# Patient Record
Sex: Female | Born: 1970 | Race: White | Hispanic: No | Marital: Married | Smoking: Former smoker
Health system: Southern US, Community
[De-identification: ages and names within clinical notes are randomized; demographics above are authoritative.]

## PROBLEM LIST (undated history)

## (undated) HISTORY — PX: TONSILLECTOMY: SHX5217

---

## 1999-04-25 ENCOUNTER — Other Ambulatory Visit: Admission: RE | Admit: 1999-04-25 | Discharge: 1999-04-25 | Payer: Self-pay | Admitting: Obstetrics and Gynecology

## 1999-05-29 ENCOUNTER — Other Ambulatory Visit: Admission: RE | Admit: 1999-05-29 | Discharge: 1999-05-29 | Payer: Self-pay | Admitting: Obstetrics and Gynecology

## 2000-01-05 ENCOUNTER — Other Ambulatory Visit: Admission: RE | Admit: 2000-01-05 | Discharge: 2000-01-05 | Payer: Self-pay | Admitting: Obstetrics and Gynecology

## 2001-06-19 ENCOUNTER — Other Ambulatory Visit: Admission: RE | Admit: 2001-06-19 | Discharge: 2001-06-19 | Payer: Self-pay | Admitting: Obstetrics and Gynecology

## 2001-10-06 ENCOUNTER — Emergency Department (HOSPITAL_COMMUNITY): Admission: EM | Admit: 2001-10-06 | Discharge: 2001-10-06 | Payer: Self-pay | Admitting: Emergency Medicine

## 2002-06-14 ENCOUNTER — Encounter: Payer: Self-pay | Admitting: Emergency Medicine

## 2002-06-14 ENCOUNTER — Emergency Department (HOSPITAL_COMMUNITY): Admission: EM | Admit: 2002-06-14 | Discharge: 2002-06-14 | Payer: Self-pay | Admitting: Emergency Medicine

## 2002-06-22 ENCOUNTER — Other Ambulatory Visit: Admission: RE | Admit: 2002-06-22 | Discharge: 2002-06-22 | Payer: Self-pay | Admitting: *Deleted

## 2002-06-22 ENCOUNTER — Other Ambulatory Visit: Admission: RE | Admit: 2002-06-22 | Discharge: 2002-06-22 | Payer: Self-pay | Admitting: Obstetrics and Gynecology

## 2002-11-17 ENCOUNTER — Emergency Department (HOSPITAL_COMMUNITY): Admission: EM | Admit: 2002-11-17 | Discharge: 2002-11-17 | Payer: Self-pay | Admitting: Emergency Medicine

## 2003-02-02 ENCOUNTER — Other Ambulatory Visit: Admission: RE | Admit: 2003-02-02 | Discharge: 2003-02-02 | Payer: Self-pay | Admitting: Obstetrics and Gynecology

## 2003-07-15 ENCOUNTER — Other Ambulatory Visit: Admission: RE | Admit: 2003-07-15 | Discharge: 2003-07-15 | Payer: Self-pay | Admitting: Obstetrics and Gynecology

## 2003-07-15 ENCOUNTER — Other Ambulatory Visit: Admission: RE | Admit: 2003-07-15 | Discharge: 2003-07-15 | Payer: Self-pay | Admitting: *Deleted

## 2003-07-23 ENCOUNTER — Ambulatory Visit (HOSPITAL_COMMUNITY): Admission: RE | Admit: 2003-07-23 | Discharge: 2003-07-23 | Payer: Self-pay | Admitting: *Deleted

## 2003-07-23 ENCOUNTER — Encounter (INDEPENDENT_AMBULATORY_CARE_PROVIDER_SITE_OTHER): Payer: Self-pay

## 2004-01-03 ENCOUNTER — Emergency Department (HOSPITAL_COMMUNITY): Admission: EM | Admit: 2004-01-03 | Discharge: 2004-01-03 | Payer: Self-pay | Admitting: Family Medicine

## 2004-01-04 ENCOUNTER — Ambulatory Visit (HOSPITAL_COMMUNITY): Admission: RE | Admit: 2004-01-04 | Discharge: 2004-01-04 | Payer: Self-pay | Admitting: Unknown Physician Specialty

## 2004-01-04 IMAGING — CR DG CHEST 2V
2 series · 2 of 2 positions shown · non-contrast
Comparison: none

CLINICAL DATA: Cervical lymphadenopathy.
 TWO VIEW CHEST   - [DATE]
 No prior chest films for comparison.
 Radiographic appearance of the chest appears normal with no evidence of abnormal soft tissue to suggest either mediastinal or hilar adenopathy.  The lungs are clear without infiltrate or edema.  There is mild atelectasis at the right lung base.  Heart size is normal.  The visualized bony thorax is unremarkable.
 IMPRESSION
 Normal appearance of the chest.  No evidence by plain film of obvious hilar or mediastinal adenopathy.

[view not recorded (1 of 2)]
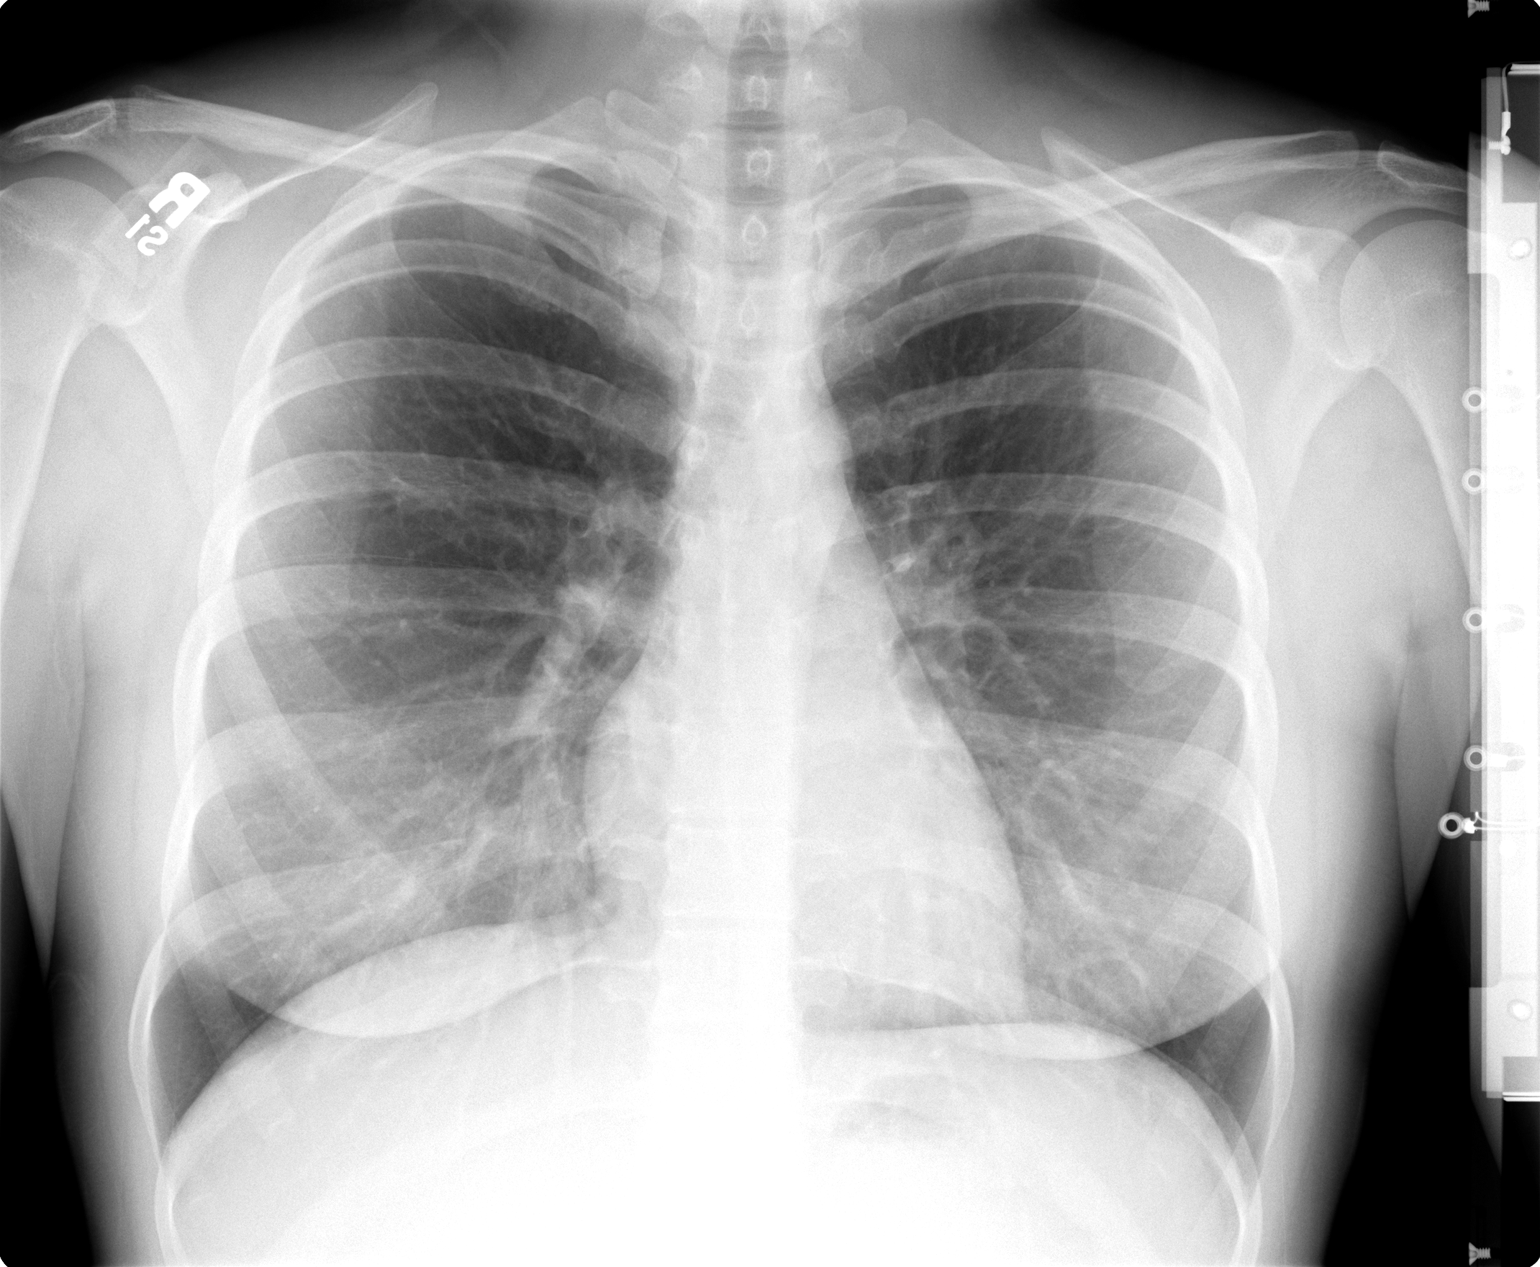

[view not recorded (2 of 2)]
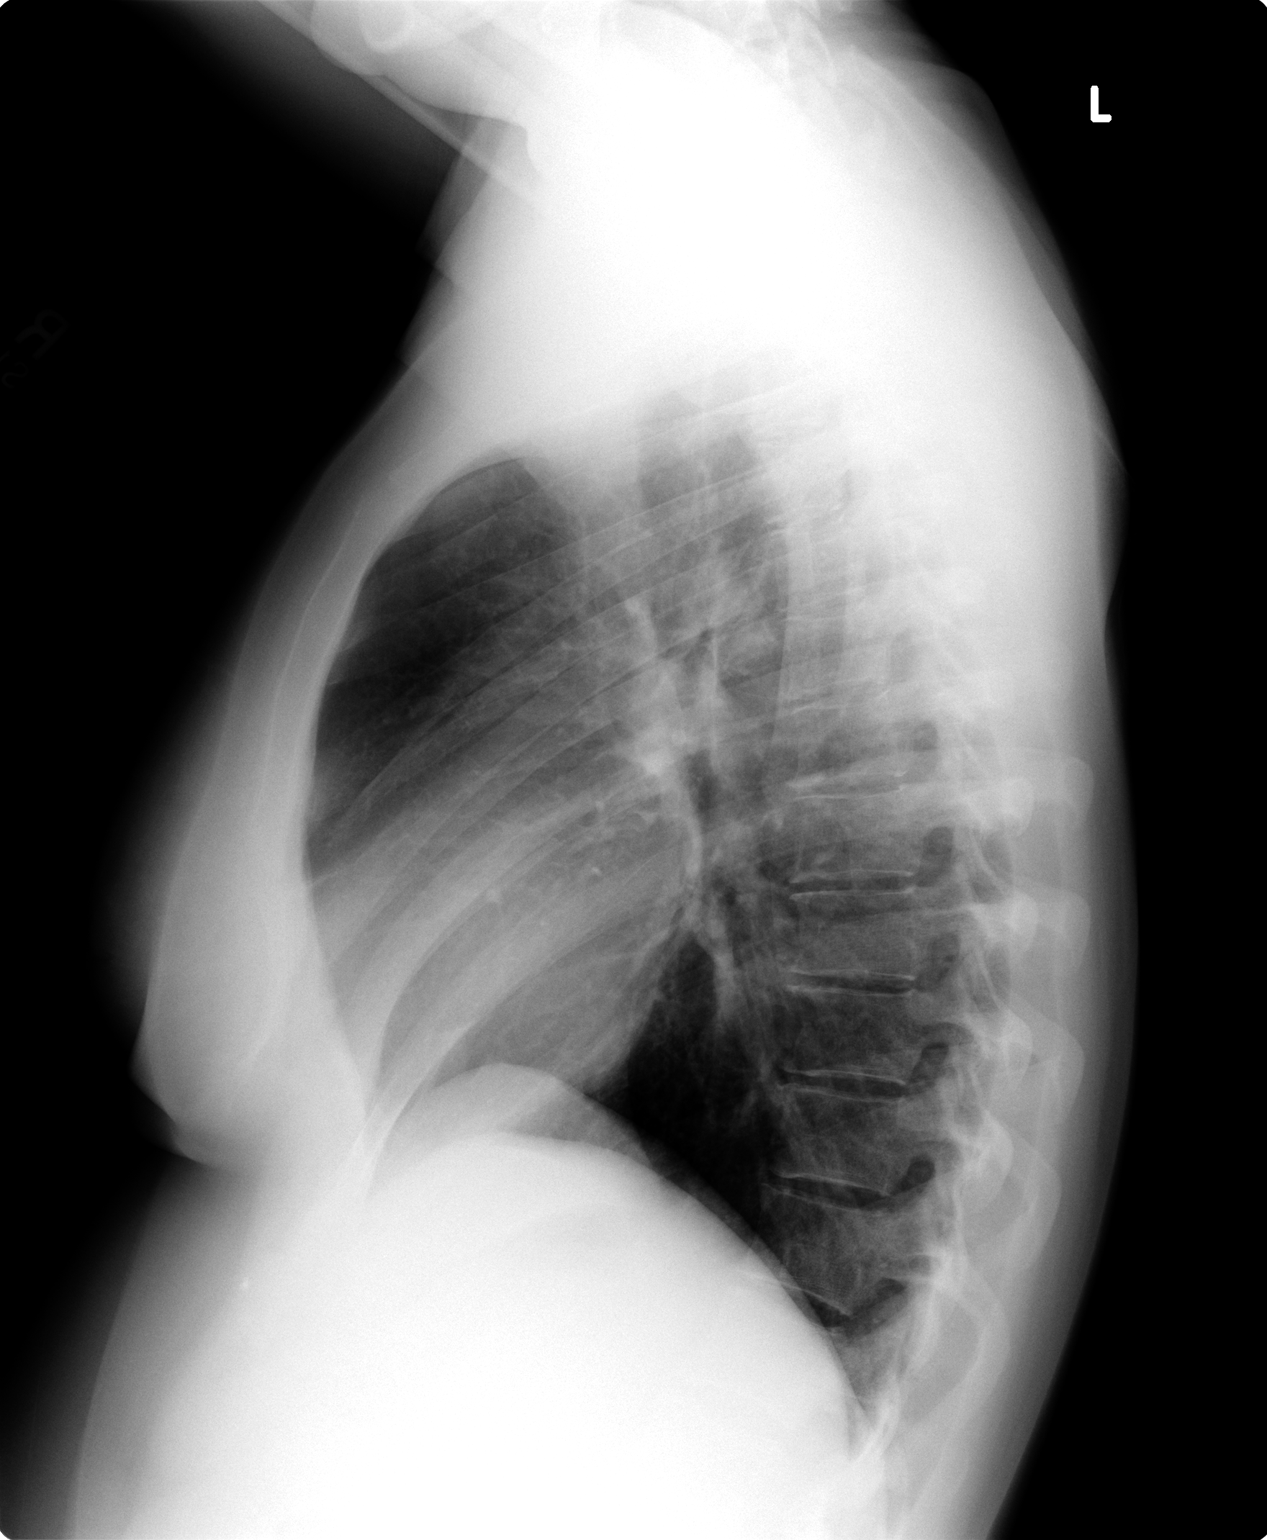

[2 of 2 positions shown; findings below may reference images not displayed]

## 2004-02-08 ENCOUNTER — Other Ambulatory Visit: Admission: RE | Admit: 2004-02-08 | Discharge: 2004-02-08 | Payer: Self-pay | Admitting: Obstetrics and Gynecology

## 2005-01-24 ENCOUNTER — Ambulatory Visit: Payer: Self-pay | Admitting: Family Medicine

## 2005-02-12 ENCOUNTER — Other Ambulatory Visit: Admission: RE | Admit: 2005-02-12 | Discharge: 2005-02-12 | Payer: Self-pay | Admitting: Obstetrics and Gynecology

## 2006-02-18 ENCOUNTER — Other Ambulatory Visit: Admission: RE | Admit: 2006-02-18 | Discharge: 2006-02-18 | Payer: Self-pay | Admitting: Obstetrics & Gynecology

## 2007-07-31 ENCOUNTER — Inpatient Hospital Stay (HOSPITAL_COMMUNITY): Admission: AD | Admit: 2007-07-31 | Discharge: 2007-08-03 | Payer: Self-pay | Admitting: *Deleted

## 2011-04-03 NOTE — Op Note (Signed)
NAMEJAMESA, TEDRICK NO.:  [id number]   MEDICAL RECORD NO.:  [id number]          PATIENT TYPE:  INP   LOCATION:  9199                          [facility]ACILITY:  [facility]   PHYSICIAN:  Gerri Spore B. Earlene Plater, M.D.  DATE OF BIRTH:  22-Sep-1971   DATE OF PROCEDURE:  07/31/2007  DATE OF DISCHARGE:                               OPERATIVE REPORT   PREOPERATIVE DIAGNOSES:  1. Thirty-nine-plus-week intrauterine pregnancy.  2. Gestational hypertension.  3. Suspected macrosomia.   POSTOPERATIVE DIAGNOSES:  1. Thirty-nine-plus-week intrauterine pregnancy.  2. Gestational hypertension.  3. Suspected macrosomis.   OPERATION/PROCEDURE:  Primary low transverse cesarean section.   SURGEON:  Chester Holstein. Earlene Plater, M.D.   ANESTHESIA:  Spinal.   SPECIMENS:  None.   BLOOD LOSS:  700 mL.   COMPLICATIONS:  None.   FINDINGS:  Viable female, 8 and 9 Apgars, 8 pounds 14 ounces.  Normal  uterus, tubes and ovaries.   INDICATIONS:  The patient was seen in the office today for routine  follow up at 39+ weeks.  Found to have blood pressure of 160/100 with  some associated nausea, chest discomfort and occasional visual  disturbances. Trace protein on office urine dipstick. She was sent to  maternity admissions for further evaluation.  Serial blood pressures  remained in the 140/90 range.  Preeclampsia labs were normal.  The fetus  was reassuring and ultrasound was ordered for estimated fetal weight  which was 9 pounds 5 ounces.  It was recommended the patient be  delivered today given her elevated blood pressure at term.  Her cervix  was unfavorable and there was suspected macrosomic fetus.  Limitations  of ultrasound estimate were discussed at length with the patient and her  husband and the margin of error being at least plus/minus 1 pound.  Discussed possibility of a macrosomic fetus leading to the need for  cesarean section and potential increased risk of fetal injury although  not yet at the  5000-gram range where it would be absolutely wrong to  have a trial of  labor.  The patient was offered induction today versus  primary cesarean section.  The risks and benefits of each were  discussed.  The patient and her husband elected to undergo primary  cesarean section.  Risks of surgery were discussed, including infection,  bleeding, damage to surrounding organs.   PROCEDURE:  The patient taken to the operating room, spinal anesthesia  obtained.  She is prepped and draped in the standard fashion.  Foley  catheter inserted into the bladder.   A Pfannenstiel incision was made.  Fascia divided sharply.  Posterior  sheath and perineum elevated and entered sharply.  Bladder blade  inserted.  Bladder flap created sharply.  Bladder blade reinserted.  Uterine incision made in low transverse fashion with a knife.  Clear  fluid at amniotomy.  Incision extended laterally with bandage scissors.  Vertex was elevated to the incision and with fundal pressure, delivered  without difficulty.  Nose and mouth are suctioned with the bulb.  The  fetus was difficult to deliver through C-section incision.  Shoulders  were rotated to the oblique angle and the anterior (right) shoulder was  delivered first by delivery of the arm with flexion at the elbow.  The  remainder of the infant delivered without difficulty.  Cord clamped and  cut and infant handed off waiting pediatricians.  One gram of Ancef  given at cord clamp.   Placenta was removed manually.  Uterus exteriorized and cleared of all  clots and debris.  Uterine incision was free of extension.  Uterine  incision was closed in a running locked stitch of 0 chromic.  Second  imbricating layer placed with the same suture with hemostasis obtained.   Uterus was returned the abdomen and pelvis irrigated.  Uterus was  hemostatic as was the bladder flap and subfascial space.  The fascia was  closed with running stitch of 0 Vicryl.  Subcutaneous tissue  was  irrigated, made hemostatic with a Bovie.  Skin was closed with staples.   The patient tolerated the procedure well with no complications.  She was  sent to the recovery room in stable condition.  All counts correct per  the operating staff.      Gerri Spore B. Earlene Plater, M.D.  Electronically Signed     WBD/MEDQ  D:  07/31/2007  T:  08/01/2007  Job:  [id number]

## 2011-04-06 NOTE — Discharge Summary (Signed)
[name], [name] NO.:  [id number]   MEDICAL RECORD NO.:  [id number]          PATIENT TYPE:  INP   LOCATION:  9121                          [facility]   PHYSICIAN:  Gerri Spore B. Earlene Plater, M.D.  DATE OF BIRTH:  07/07/1971   DATE OF ADMISSION:  07/31/2007  DATE OF DISCHARGE:  08/03/2007                               DISCHARGE SUMMARY   ADMISSION DIAGNOSIS:  1. A 39-week intrauterine pregnancy.  2. Hypertension.  3. Suspected macrosomia.   DISCHARGE DIAGNOSES:  1. A 39-week intrauterine pregnancy.  2. Hypertension.  3. Suspected macrosomia.   PROCEDURE:  Primary low transverse C-section.   HISTORY OF PRESENT ILLNESS:  A 40 year old white female gravida 2, para  0-0-1-0 presented at 39.5 weeks with routine office visit.  Was found to  have blood pressure 160/100 with slight nausea and visual changes.  There was trace protein on urinalysis.  The patient was evaluated by  maternity admissions.  Preeclampsia labs were normal.  Blood pressures  persisted in the 138-140 over 90's range with reassuring fetal heart  rate tracing.  Ultrasound was ordered to ensure adequate fetal growth,  and it was found that the estimated fetal weight was 4237 g, 9 pounds 5  ounces with the range at the top end of 4873 g per radiology report.  It  was specifically mentioned in the radiologist's report that macrosomia  could not be excluded.  The patient was informed of the possibility that  she had mild preeclampsia, and I recommended delivery given that she was  at term.  We discussed at length her route of delivery including  induction of labor versus cesarean section.  We discussed the  inaccuracies of ultrasound estimations of fetal weight.  We also  discussed the potential for any macrosomic fetus given the range of  estimates which were provided by radiology.  Subsequent risk of cesarean  section, shoulder dystocia, and birth injury were all discussed with the  patient and  her husband.  After being fully apprised of the risks and  benefits of each option, they chose to have a primary C-section.  Risks  of surgery were discussed preoperatively as well.   The patient was subsequently delivered by primary low transverse C-  section.  Viable female with 8 and 9 Apgars, 8 pounds 14 ounces.  Normal-  appearing uterus, tubes, and ovaries noted at time of surgery.   Postoperatively the patient rapidly regained her ability to ambulate,  void, and tolerate a regular diet.  Her blood pressures improved after  delivery, and she did not require magnesium for seizure prevention.   She was discharged home on the third postoperative day in satisfactory  condition.   DISCHARGE INSTRUCTIONS:  Discharge instructions per booklet.  Follow-up  one week blood pressure check.   DISCHARGE DISPOSITION:  Satisfactory.   MEDICATIONS:  Tylox 1-2 p.o. q.4-6 h. p.r.n. pain.      Gerri Spore B. Earlene Plater, M.D.  Electronically Signed     WBD/MEDQ  D:  09/04/2007  T:  09/05/2007  Job:  [id number]

## 2011-04-06 NOTE — H&P (Signed)
   NAME:  [name], [name]                           ACCOUNT NO.:  [id number]   MEDICAL RECORD NO.:  [id number]                   PATIENT TYPE:  AMB   LOCATION:  SDC                                  [facility]   PHYSICIAN:  [place] B. Earlene Plater, M.D.               DATE OF BIRTH:  23-Apr-1971   DATE OF ADMISSION:  DATE OF DISCHARGE:                                HISTORY & PHYSICAL   PREOPERATIVE DIAGNOSIS:  Five-week missed AB.   INTENDED PROCEDURE:  Suction curettage.   HISTORY OF PRESENT ILLNESS:  A 40 year old white female, gravida 1, para 0,  9+ weeks by last menstrual period, seen in the office on August 31 for  routine visit.  At that time no fetal heart tones were heard.  Ultrasound  was performed, which showed an abnormally enlarged yolk sac and a 3.8 cm  gestational sac without fetal pole or fetal cardiac activity.  The patient  was diagnosed with a missed abortion.  Options discussed,  including  expectant management versus operative management.  The patient desires to  proceed with suction curettage.   PAST MEDICAL HISTORY:  None.   PAST SURGICAL HISTORY:  None.   FAMILY HISTORY:  Noncontributory.   MEDICATIONS:  Prenatal vitamins.   ALLERGIES:  PENICILLIN, causing a rash.   PRENATAL LABORATORY DATA:  The patient's blood type is O positive.   PHYSICAL EXAMINATION:  VITAL SIGNS:  Blood pressure 110/70, weight 161.  GENERAL:  Alert and oriented, in no acute distress.  SKIN:  No known lesions.  CARDIAC:  Regular rate and rhythm.  CHEST:  Lungs clear to auscultation.  ABDOMEN:  Liver and spleen normal, no hernia.  PELVIC:  Normal external genitalia.  Vagina and cervix are normal.  Uterus  approximately eight weeks' size, nontender, no adnexal masses or tenderness.   Ultrasound results showed missed AB as outlined above.   ASSESSMENT:  A five-week missed abortion.  The patient desires operative  management.    PLAN:  Suction curettage.  Operative risks discussed,  including infection,  bleeding, uterine perforation, damage to surrounding organs, and incomplete  evacuation.  All questions answered.  The patient wishes to proceed.                                               Gerri Spore B. Earlene Plater, M.D.    WBD/MEDQ  D:  07/22/2003  T:  07/23/2003  Job:  [id number]

## 2011-04-06 NOTE — Op Note (Signed)
   NAME:  [name], [name]                           ACCOUNT NO.:  [id number]   MEDICAL RECORD NO.:  [id number]                   PATIENT TYPE:  AMB   LOCATION:  SDC                                  [facility]   PHYSICIAN:  [place] B. Earlene Plater, M.D.               DATE OF BIRTH:  [birth year]   DATE OF PROCEDURE:  07/23/2003  DATE OF DISCHARGE:                                 OPERATIVE REPORT   PREOPERATIVE DIAGNOSIS:  Five-week missed abortion.   POSTOPERATIVE DIAGNOSIS:  Five-week missed abortion.   PROCEDURE:  Suction curettage.   SURGEON:  Chester Holstein. Earlene Plater, M.D.   ANESTHESIA:  MAC and 15 mL of 2% lidocaine paracervical block.   ESTIMATED BLOOD LOSS:  150.   SPECIMENS:  Products of conception.   COMPLICATIONS:  None.   INDICATIONS:  Patient with nine-week pregnancy by dates and no fetal heart  tones in the office.  Ultrasound confirmed a large gestational sac with an  enlarged, abnormal-appearing yolk sac and no fetal pole or fetal cardiac  activity.  The patient desires operative management.   DESCRIPTION OF PROCEDURE:  The patient was taken to the operating room and  MAC anesthesia obtained.  She was placed in the ski position and prepped and  draped in a standard fashion.  Bladder emptied with a red rubber catheter.  Exam under anesthesia showed a midposition six to seven-week size uterus.   Speculum inserted, paracervical block placed, and a single-tooth tenaculum  attached to the anterior lip of the cervix.   The cervix was dilated to a #21 without difficulty.  The #7 curved cannula  was inserted with suction on. Several passes were required to clear the  uterus.  There was increased bleeding until the uterus was evacuated.  The  endometrium was curetted and a gritty texture noted.  The suction cannula  was once again inserted and no additional products returned.  Therefore, the  procedure was terminated.  Hemostasis obtained.  The single-tooth removed.  Cervix  hemostatic.  Speculum removed.  Exam under anesthesia again shows  uterus unchanged from prior to procedure.   The patient tolerated the procedure well.  There were no complications.  She  was taken to the recovery room awake, alert, and in a stable condition.                                              Gerri Spore B. Earlene Plater, M.D.   WBD/MEDQ  D:  07/23/2003  T:  07/24/2003  Job:  [id number]

## 2011-08-31 LAB — COMPREHENSIVE METABOLIC PANEL
ALT: 17
AST: 26
Albumin: 3 — ABNORMAL LOW
Alkaline Phosphatase: 135 — ABNORMAL HIGH
BUN: 3 — ABNORMAL LOW
CO2: 21
Calcium: 9.4
Chloride: 106
Creatinine, Ser: 0.56
GFR calc Af Amer: >60
GFR calc non Af Amer: >60
Glucose, Bld: 78
Potassium: 3.7
Sodium: 136
Total Bilirubin: 0.6
Total Protein: 6

## 2011-08-31 LAB — CBC
HCT: 37.4
Hemoglobin: 10.6 — ABNORMAL LOW
Hemoglobin: 13.4
MCHC: 35.8
MCV: 96.1
Platelets: 276
RBC: 3.11 — ABNORMAL LOW
RBC: 3.89
RDW: 14.1 — ABNORMAL HIGH
WBC: 12.8 — ABNORMAL HIGH
WBC: 13.1 — ABNORMAL HIGH

## 2011-08-31 LAB — URIC ACID: Uric Acid, Serum: 4.8

## 2011-08-31 LAB — LACTATE DEHYDROGENASE: LDH: 154

## 2013-02-26 ENCOUNTER — Telehealth: Payer: Self-pay | Admitting: Physician Assistant

## 2013-02-27 ENCOUNTER — Other Ambulatory Visit: Payer: Self-pay | Admitting: *Deleted

## 2013-02-27 NOTE — Telephone Encounter (Signed)
Patient last seen on 11-13-12 by ACM. Please advise. Thank you

## 2013-02-28 ENCOUNTER — Ambulatory Visit (INDEPENDENT_AMBULATORY_CARE_PROVIDER_SITE_OTHER): Payer: BC Managed Care – PPO | Admitting: Family Medicine

## 2013-02-28 ENCOUNTER — Encounter: Payer: Self-pay | Admitting: Family Medicine

## 2013-02-28 VITALS — BP 137/89 | HR 79 | Temp 98.6°F | Ht 67.0 in | Wt 179.8 lb

## 2013-02-28 DIAGNOSIS — F4541 Pain disorder exclusively related to psychological factors: Secondary | ICD-10-CM | POA: Insufficient documentation

## 2013-02-28 DIAGNOSIS — F4389 Other reactions to severe stress: Secondary | ICD-10-CM

## 2013-02-28 DIAGNOSIS — F438 Other reactions to severe stress: Secondary | ICD-10-CM

## 2013-02-28 DIAGNOSIS — G44209 Tension-type headache, unspecified, not intractable: Secondary | ICD-10-CM

## 2013-02-28 DIAGNOSIS — F4329 Adjustment disorder with other symptoms: Secondary | ICD-10-CM | POA: Insufficient documentation

## 2013-02-28 MED ORDER — BUPROPION HCL ER (XL) 300 MG PO TB24: 300.0000 mg | ORAL_TABLET | Freq: Every day | ORAL | Status: DC

## 2013-02-28 MED ORDER — SUMATRIPTAN SUCCINATE 100 MG PO TABS: 100.0000 mg | ORAL_TABLET | ORAL | Status: AC | PRN

## 2013-02-28 MED ORDER — METHOCARBAMOL 750 MG PO TABS: 750.0000 mg | ORAL_TABLET | Freq: Every day | ORAL | Status: DC

## 2013-02-28 MED ORDER — CLONAZEPAM 0.5 MG PO TABS: 0.5000 mg | ORAL_TABLET | Freq: Every evening | ORAL | Status: DC | PRN

## 2013-02-28 NOTE — Progress Notes (Signed)
Patient ID: [name], female   DOB: 1971-10-09, 42 y.o.   MRN: [id number] SUBJECTIVE:   HPI: Headaches more frequent. Tension type. Works at [employer] in the [place]. Major lay offs, and husband out off of work due to disability. Stress affecting her. Cannot sleep, more headaches. Mind racing ayt nights. Wakes frequently.   PMH/PSH: reviewed/updated in Epic  SH/FH: reviewed/updated in Epic  Allergies: reviewed/updated in Epic  Medications: reviewed/updated in Epic  Immunizations: reviewed/updated in Epic   ROS: As above in the HPI. All other systems are stable or negative.  OBJECTIVE:     APPEARANCE:  tearful Patient in no acute distress.The patient appeared well nourished and normally developed. Acyanotic.  Waist:  VITAL SIGNS:BP 137/89  Pulse 79  Temp(Src) 98.6 F (37 C) (Oral)  Ht 5\' 7"  (1.702 m)  Wt 179 lb 12.8 oz (81.557 kg)  BMI 28.15 kg/m2  LMP 02/01/2013   SKIN: warm and  Dry without overt rashes, tattoos and scars  HEAD and Neck: without JVD, Normal No scleral icterus  CHEST & LUNGS: Clear  CVS: Reveals the PMI to be normally located. Regular rhythm, First and Second Heart sounds are normal, and absence of murmurs, rubs or gallops.  ABDOMEN:  Benign,, no organomegaly, no masses, no Abdominal Aortic enlargement. No Guarding , no rebound. No Bruits.  RECTAL:n/a   GU:n/a  EXTREMETIES: nonedematous. Both Femoral and Pedal pulses are normal.  MUSCULOSKELETAL:  Spine: normal Joints: intact  NEUROLOGIC: oriented to time,place and person; nonfocal. Strength is normal Sensory is normal Reflexes are normal Cranial Nerves are normal.   ASSESSMENT:  Stress and adjustment reaction  Stress headaches  PLAN:  No orders of the defined types were placed in this encounter.   No results found for this or any previous visit (from the past 24 hour(s)). Meds ordered this encounter  Medications  . DISCONTD: buPROPion (WELLBUTRIN XL) 150 MG 24 hr tablet     Sig: Take 150 mg by mouth daily.  . Multiple Vitamins-Minerals (MULTIVITAMIN WITH MINERALS) tablet    Sig: Take 1 tablet by mouth daily.  Marland Kitchen buPROPion (WELLBUTRIN XL) 300 MG 24 hr tablet    Sig: Take 1 tablet (300 mg total) by mouth daily.    Dispense:  30 tablet    Refill:  3  . clonazePAM (KLONOPIN) 0.5 MG tablet    Sig: Take 1 tablet (0.5 mg total) by mouth at bedtime as needed for anxiety.    Dispense:  15 tablet    Refill:  0   stress reduction. Supportive therapy. Return to clinic in 2 months to reassess medical therapy.  Irmgard Rampersaud P. Modesto Charon, M.D.

## 2013-03-23 ENCOUNTER — Other Ambulatory Visit: Payer: Self-pay | Admitting: Family Medicine

## 2013-03-24 ENCOUNTER — Ambulatory Visit: Payer: Self-pay | Admitting: Nurse Practitioner

## 2013-03-25 NOTE — Telephone Encounter (Signed)
error 

## 2013-03-25 NOTE — Telephone Encounter (Signed)
LAST RF 02/28/13. LAST OV 02/28/13. CALL IN [facility].

## 2013-04-30 ENCOUNTER — Ambulatory Visit: Payer: BC Managed Care – PPO | Admitting: Family Medicine

## 2013-06-22 ENCOUNTER — Telehealth: Payer: Self-pay | Admitting: Family Medicine

## 2013-06-22 NOTE — Telephone Encounter (Signed)
149/103 150/90 appt made for ck up

## 2013-06-25 ENCOUNTER — Encounter: Payer: Self-pay | Admitting: Family Medicine

## 2013-06-25 ENCOUNTER — Ambulatory Visit (INDEPENDENT_AMBULATORY_CARE_PROVIDER_SITE_OTHER): Payer: PRIVATE HEALTH INSURANCE | Admitting: Family Medicine

## 2013-06-25 VITALS — BP 131/91 | HR 76 | Temp 97.8°F | Ht 66.75 in | Wt 178.0 lb

## 2013-06-25 DIAGNOSIS — R0609 Other forms of dyspnea: Secondary | ICD-10-CM

## 2013-06-25 DIAGNOSIS — G4700 Insomnia, unspecified: Secondary | ICD-10-CM

## 2013-06-25 DIAGNOSIS — I10 Essential (primary) hypertension: Secondary | ICD-10-CM

## 2013-06-25 DIAGNOSIS — R0683 Snoring: Secondary | ICD-10-CM

## 2013-06-25 DIAGNOSIS — R5381 Other malaise: Secondary | ICD-10-CM

## 2013-06-25 DIAGNOSIS — R0989 Other specified symptoms and signs involving the circulatory and respiratory systems: Secondary | ICD-10-CM

## 2013-06-25 DIAGNOSIS — E559 Vitamin D deficiency, unspecified: Secondary | ICD-10-CM

## 2013-06-25 DIAGNOSIS — R5383 Other fatigue: Secondary | ICD-10-CM

## 2013-06-25 DIAGNOSIS — R51 Headache: Secondary | ICD-10-CM

## 2013-06-25 LAB — POCT CBC
Granulocyte percent: 71.3 %G (ref 37–80)
HCT, POC: 40.2 % (ref 37.7–47.9)
Hemoglobin: 13.3 g/dL (ref 12.2–16.2)
Lymph, poc: 2 (ref 0.6–3.4)
MCH, POC: 29.8 pg (ref 27–31.2)
MCHC: 33.1 g/dL (ref 31.8–35.4)
MCV: 89.8 fL (ref 80–97)
MPV: 7 fL (ref 0–99.8)
POC Granulocyte: 5.3 (ref 2–6.9)
POC LYMPH PERCENT: 26.3 %L (ref 10–50)
Platelet Count, POC: 377 10*3/uL (ref 142–424)
RBC: 4.5 M/uL (ref 4.04–5.48)
RDW, POC: 13.1 %
WBC: 7.5 10*3/uL (ref 4.6–10.2)

## 2013-06-25 MED ORDER — CLONAZEPAM 0.5 MG PO TABS: 0.5000 mg | ORAL_TABLET | Freq: Every evening | ORAL | Status: DC | PRN

## 2013-06-25 MED ORDER — LISINOPRIL 10 MG PO TABS: 10.0000 mg | ORAL_TABLET | Freq: Every day | ORAL | Status: DC

## 2013-06-25 NOTE — Progress Notes (Signed)
  Subjective:    Patient ID: [name], female    DOB: 03/20/1971, 42 y.o.   MRN: [id number]  HPI This 42 y.o. female presents for evaluation of elevated blood pressure.  She has Been having some headaches when she has had elevated headaches.  She has  Had migraine headaches in the past and these headaches are different.  She has Hx of insomnia.  She does admit to loud snoring.  She states she does have hypersomnia And has difficulty driving long distances.  She has seen her OBGYN and she has had hgbaic done and it was 6.0 % and she is taking metformin for fertility and she has stayed on this.    Review of Systems No chest pain, SOB, HA, dizziness, vision change, N/V, diarrhea, constipation, dysuria, urinary urgency or frequency, myalgias, arthralgias or rash.     Objective:   Physical Exam  Vital signs noted  Well developed well nourished female.  HEENT - Head atraumatic Normocephalic                Eyes - PERRLA, Conjuctiva - clear Sclera- Clear EOMI                Ears - EAC's Wnl TM's Wnl Gross Hearing WNL                Nose - Nares patent                 Throat - oropharanx wnl Respiratory - Lungs CTA bilateral Cardiac - RRR S1 and S2 without murmur GI - Abdomen soft Nontender and bowel sounds active x 4 Extremities - No edema. Neuro - Grossly intact.      Assessment & Plan:  Unspecified vitamin D deficiency - Plan: Vitamin D 25 hydroxy  Snoring - Sleep study  Other malaise and fatigue - Plan: Nocturnal polysomnography (NPSG), POCT CBC, CMP14+EGFR, TSH, Lipid panel  Essential hypertension, benign - Plan: lisinopril (PRINIVIL,ZESTRIL) 10 MG tablet, POCT CBC, CMP14+EGFR  Headache(784.0) - Plan: Nocturnal polysomnography (NPSG)  Continue with imitrex prn.  Insomnia - Plan: clonazePAM (KLONOPIN) 0.5 MG tablet one po qhs prn #15 w/1rf.  Follow up in one month

## 2013-06-25 NOTE — Patient Instructions (Signed)

## 2013-06-26 LAB — LIPID PANEL
Chol/HDL Ratio: 5.4 ratio units — ABNORMAL HIGH (ref 0.0–4.4)
Cholesterol, Total: 182 mg/dL (ref 100–199)
HDL: 34 mg/dL — ABNORMAL LOW (ref >39)
LDL Calculated: 99 mg/dL (ref 0–99)
Triglycerides: 247 mg/dL — ABNORMAL HIGH (ref 0–149)
VLDL Cholesterol Cal: 49 mg/dL — ABNORMAL HIGH (ref 5–40)

## 2013-06-26 LAB — CMP14+EGFR
ALT: 30 IU/L (ref 0–32)
AST: 19 IU/L (ref 0–40)
Albumin/Globulin Ratio: 1.9 (ref 1.1–2.5)
Albumin: 4.7 g/dL (ref 3.5–5.5)
Alkaline Phosphatase: 62 IU/L (ref 39–117)
BUN/Creatinine Ratio: 21 — ABNORMAL HIGH (ref 8–20)
BUN: 14 mg/dL (ref 6–20)
CO2: 23 mmol/L (ref 18–29)
Calcium: 9.2 mg/dL (ref 8.7–10.2)
Chloride: 101 mmol/L (ref 97–108)
Creatinine, Ser: 0.68 mg/dL (ref 0.57–1.00)
GFR calc Af Amer: 127 mL/min/{1.73_m2} (ref >59)
GFR calc non Af Amer: 111 mL/min/{1.73_m2} (ref >59)
Globulin, Total: 2.5 g/dL (ref 1.5–4.5)
Glucose: 92 mg/dL (ref 65–99)
Potassium: 4.4 mmol/L (ref 3.5–5.2)
Sodium: 137 mmol/L (ref 134–144)
Total Bilirubin: 0.3 mg/dL (ref 0.0–1.2)
Total Protein: 7.2 g/dL (ref 6.0–8.5)

## 2013-06-26 LAB — TSH: TSH: 2.95 u[IU]/mL (ref 0.450–4.500)

## 2013-06-26 LAB — VITAMIN D 25 HYDROXY (VIT D DEFICIENCY, FRACTURES): Vit D, 25-Hydroxy: 35.8 ng/mL (ref 30.0–100.0)

## 2013-07-01 ENCOUNTER — Other Ambulatory Visit (HOSPITAL_COMMUNITY): Payer: Self-pay

## 2013-07-01 DIAGNOSIS — R0683 Snoring: Secondary | ICD-10-CM

## 2013-09-28 ENCOUNTER — Other Ambulatory Visit: Payer: Self-pay

## 2013-09-28 NOTE — Telephone Encounter (Signed)
Last seen 06/25/13  B [place]

## 2013-09-29 ENCOUNTER — Other Ambulatory Visit: Payer: Self-pay | Admitting: *Deleted

## 2013-09-29 MED ORDER — BUPROPION HCL ER (XL) 300 MG PO TB24: 300.0000 mg | ORAL_TABLET | Freq: Every day | ORAL | Status: DC

## 2014-11-23 ENCOUNTER — Encounter: Payer: Self-pay | Admitting: Nurse Practitioner

## 2014-11-23 ENCOUNTER — Ambulatory Visit (INDEPENDENT_AMBULATORY_CARE_PROVIDER_SITE_OTHER): Payer: PRIVATE HEALTH INSURANCE | Admitting: Nurse Practitioner

## 2014-11-23 VITALS — BP 130/88 | HR 89 | Temp 99.4°F | Ht 66.75 in | Wt 193.2 lb

## 2014-11-23 DIAGNOSIS — J209 Acute bronchitis, unspecified: Secondary | ICD-10-CM

## 2014-11-23 MED ORDER — HYDROCODONE-HOMATROPINE 5-1.5 MG/5ML PO SYRP
5.0000 mL | ORAL_SOLUTION | Freq: Three times a day (TID) | ORAL | Status: AC | PRN
Start: 2014-11-23 — End: ?

## 2014-11-23 MED ORDER — METHYLPREDNISOLONE ACETATE 80 MG/ML IJ SUSP
80.0000 mg | Freq: Once | INTRAMUSCULAR | Status: AC
  Administered 2014-11-23: 80 mg via INTRAMUSCULAR

## 2014-11-23 NOTE — Patient Instructions (Signed)

## 2014-11-23 NOTE — Progress Notes (Signed)
   Subjective:    Patient ID: [name], female    DOB: [birth year], 44 y.o.   MRN: [id number]  HPI Patioent in c/o cough that started over a week ago- [facility] and was given tussinex , tessalon perles and inhaler- Sunday went to urgent care was given zithromax and dx with pneumonia- she is still not feeling better. Still coughing.    Review of Systems  Constitutional: Negative.   HENT: Positive for congestion.   Respiratory: Positive for cough.   Cardiovascular: Negative.   Genitourinary: Negative.   Neurological: Negative.   Psychiatric/Behavioral: Negative.   All other systems reviewed and are negative.      Objective:   Physical Exam  Constitutional: She is oriented to person, place, and time. She appears well-developed and well-nourished.  Cardiovascular: Normal rate, regular rhythm and normal heart sounds.   Pulmonary/Chest: Effort normal. She has wheezes (insp nad exp bil bases. Exp wheezes upper lobes).  Deep cough   Abdominal: Soft.  Neurological: She is alert and oriented to person, place, and time.  Skin: Skin is warm and dry.  Psychiatric: She has a normal mood and affect. Her behavior is normal. Judgment and thought content normal.   BP 130/88 mmHg  Pulse 89  Temp(Src) 99.4 F (37.4 C) (Oral)  Ht 5' 6.75" (1.695 m)  Wt 193 lb 3.2 oz (87.635 kg)  BMI 30.50 kg/m2        Assessment & Plan:   1. Acute bronchitis, unspecified organism    Meds ordered this encounter  Medications  . methylPREDNISolone acetate (DEPO-MEDROL) injection 80 mg    Sig:   . HYDROcodone-homatropine (HYCODAN) 5-1.5 MG/5ML syrup    Sig: Take 5 mLs by mouth every 8 (eight) hours as needed for cough.    Dispense:  120 mL    Refill:  0    Order Specific Question:  Supervising Provider    Answer:  Ernestina PennaMOORE, DONALD W [[id number]]   1. Take meds as prescribed 2. Use a cool mist humidifier especially during the winter months and when heat has been humid. 3. Use saline nose  sprays frequently 4. Saline irrigations of the nose can be very helpful if done frequently.  * 4X daily for 1 week*  * Use of a nettie pot can be helpful with this. Follow directions with this* 5. Drink plenty of fluids 6. Keep thermostat turn down low 7.For any cough or congestion  Use plain Mucinex- regular strength or max strength is fine   * Children- consult with Pharmacist for dosing 8. For fever or aces or pains- take tylenol or ibuprofen appropriate for age and weight.  * for fevers greater than 101 orally you may alternate ibuprofen and tylenol every  3 hours.   Mary-Margaret Daphine DeutscherMartin, FNP

## 2020-05-11 ENCOUNTER — Other Ambulatory Visit: Payer: Self-pay

## 2020-05-11 ENCOUNTER — Ambulatory Visit: Payer: No Typology Code available for payment source | Attending: Physician Assistant | Admitting: Physical Therapy

## 2020-05-11 DIAGNOSIS — M25562 Pain in left knee: Secondary | ICD-10-CM | POA: Diagnosis not present

## 2020-05-11 DIAGNOSIS — R600 Localized edema: Secondary | ICD-10-CM | POA: Diagnosis present

## 2020-05-11 DIAGNOSIS — M6281 Muscle weakness (generalized): Secondary | ICD-10-CM | POA: Insufficient documentation

## 2020-05-11 DIAGNOSIS — M25662 Stiffness of left knee, not elsewhere classified: Secondary | ICD-10-CM | POA: Insufficient documentation

## 2020-05-11 NOTE — Therapy (Signed)
[facility] [place], [place], [address] Phone: [phone]   Fax:  [phone]  Physical Therapy Evaluation  Patient Details  Name: BRYAR RENNIE MRN: [id number] Date of Birth: 1971-03-18 Referring Provider (PT): Berline Lopes PA-C.   Encounter Date: 05/11/2020   PT End of Session - 05/11/20 1351    Visit Number 1    Number of Visits 12    Date for PT Re-Evaluation 06/22/20    Authorization Type FOTO    PT Start Time 1258    PT Stop Time 1349    PT Time Calculation (min) 51 min    Activity Tolerance Patient tolerated treatment well    Behavior During Therapy [facility] for tasks assessed/performed           No past medical history on file.  Past Surgical History:  Procedure Laterality Date  . CESAREAN SECTION    . TONSILLECTOMY      There were no vitals filed for this visit.    Subjective Assessment - 05/11/20 1333    Subjective COVID-19 screen performed prior to patient entering clinic.  The patient injured her left knee in a trampoline accident on 03/28/20 and subsquently underwent a left ACL reconstruction and meniscal repair.  At 3 days post-op she removed her bandages and it was discovered the brace had been on backward.  Her brace is currently locked in full extension.  On her return visit to her doctor she was wbat over her left LE with brace donned and had weaned herself from crutches.  Her current pain-level today upon presentation to the [facility] is a 3/10.  Elevation, ice and medication decrease pain and increased activity increases pain.    Pertinent History HTN, C-section.    How long can you walk comfortably? Around home with right knee brace donnned.    Patient Stated Goals Get back to pre-injury status.    Currently in Pain? Yes    Pain Score 3               [facility] PT Assessment - 05/11/20 0001      Assessment   Medical Diagnosis Right ACL reconstruction with meniscal repair.    Referring Provider (PT)  Berline Lopes PA-C.    Onset Date/Surgical Date 04/28/20      Precautions   Precaution Comments Left knee TROM first 4 weeks with brace able to be unlock from 0-90 degrees as comfortable over the 4 weeks.      Restrictions   Other Position/Activity Restrictions Patient left knee braced and locked at 0 degrees.  Patient without assisitve device.      [place] residence      Prior Function   Level of Independence Independent      Observation/Other Assessments   Observations Left knee scope sites with steri-strips intact.    Focus on Therapeutic Outcomes (FOTO)  60% limitation.      Observation/Other Assessments-Edema    Edema Circumferential      Circumferential Edema   Circumferential - Right LT 1.5 cms > RT.      ROM / Strength   AROM / PROM / Strength AROM;Strength      AROM   Overall AROM Comments 0 degrees of left knee extension in supine with gentle PAROM into 35 degrees of flexion.        Strength   Overall Strength Comments Patient able to easily perform a left SLR without extensor lag.  Decreased  voltional contraction of left quadriceps when attempting to perform a quad set.      Palpation   Patella mobility Min+ decrease in all directions.    Palpation comment Some palpable tenderness over left lateral knee.      Ambulation/Gait   Gait Comments Patient presenting to clinic today without assistive device with brace locked at 0 degrees wbat over left LE.                      Objective measurements completed on examination: See above findings.       [facility] Adult PT Treatment/Exercise - 05/11/20 0001      Modalities   Modalities Vasopneumatic      Vasopneumatic   Number Minutes Vasopneumatic  20 minutes    Vasopnuematic Location  --   Left knee.   Vasopneumatic Pressure Low                       PT Long Term Goals - 05/11/20 1418      PT LONG TERM GOAL #1   Title Independent with a HEP.    Time  6    Period Weeks    Status New      PT LONG TERM GOAL #2   Title Active left knee flexion to 125 degrees so the patient can perform functional tasks and do so with pain not > 2-3/10.    Time 6    Period Weeks    Status New      PT LONG TERM GOAL #3   Title Increase left knee strength to a solid 4+/5 to provide good stability for accomplishment of functional activities.    Time 6    Period Weeks    Status New      PT LONG TERM GOAL #4   Title Perform a reciprocating stair gait with one railing with pain not > 2-3/10.    Time 6    Period Weeks    Status New      PT LONG TERM GOAL #5   Title Walk without brace and no deviation.    Time 6    Period Weeks    Status New                  Plan - 05/11/20 1407    Clinical Impression Statement The patient presents to OPPT s/p left ACL reconstruction and meniscal repair surgery performed on 04/28/20.  The patient is currently in a DonJoy brace locked at 0 degrees.  She currently ambulatory without assitive device and wbat over her left LE.  She has minimal swelling.  She exhibits a decrease in left patellar mobility.  She is able to perform a left SLR without extensor lag though she demonstrates a decrease in volitional activation of her left quadriceps when attemtping to perform a quad set.  Her flexion is currently limited to 35 degrees.  Patient will benefit from skilled physical therapy intervention to address deficits and pain.    Examination-Activity Limitations Locomotion Level    Examination-Participation Restrictions Other    Clinical Decision Making Low    Rehab Potential Excellent    PT Frequency 2x / week    PT Duration 6 weeks    PT Treatment/Interventions ADLs/Self Care Home Management;Cryotherapy;Electrical Stimulation;Ultrasound;Moist Heat;Gait training;Stair training;Functional mobility training;Therapeutic activities;Therapeutic exercise;Neuromuscular re-education;Manual techniques;Patient/family education;Passive  range of motion;Vasopneumatic Device    PT Next Visit Plan VMS to left quads during quad sets, left patellar  mobs, gentle left knee flexion in supine, progress to Nustep on level 1 for range of motion with brace unlocked to 90 degrees (per MD, during PT).  Per MD.  Therapist to unlock brace from 0-90 degrees as comfortable over the 4 weeks.    Consulted and Agree with Plan of Care Patient           Patient will benefit from skilled therapeutic intervention in order to improve the following deficits and impairments:  Pain, Decreased activity tolerance, Increased edema, Decreased strength, Decreased range of motion, Abnormal gait  Visit Diagnosis: Acute pain of left knee - Plan: PT plan of care cert/re-cert  Stiffness of left knee, not elsewhere classified - Plan: PT plan of care cert/re-cert  Localized edema - Plan: PT plan of care cert/re-cert  Muscle weakness (generalized) - Plan: PT plan of care cert/re-cert     Problem List Patient Active Problem List   Diagnosis Date Noted  . Stress and adjustment reaction 02/28/2013  . Stress headaches 02/28/2013    Richetta Cubillos, [place] MPT 05/11/2020, 2:25 PM  [facility] [address] [place], [place], [address] Phone: [phone]   Fax:  [phone]  Name: SHAUNIE BOEHM MRN: [id number] Date of Birth: 01/05/1971

## 2020-05-13 ENCOUNTER — Other Ambulatory Visit: Payer: Self-pay

## 2020-05-13 ENCOUNTER — Ambulatory Visit: Payer: No Typology Code available for payment source | Admitting: *Deleted

## 2020-05-13 DIAGNOSIS — M25562 Pain in left knee: Secondary | ICD-10-CM | POA: Diagnosis not present

## 2020-05-13 DIAGNOSIS — M25662 Stiffness of left knee, not elsewhere classified: Secondary | ICD-10-CM

## 2020-05-13 DIAGNOSIS — R600 Localized edema: Secondary | ICD-10-CM

## 2020-05-13 DIAGNOSIS — M6281 Muscle weakness (generalized): Secondary | ICD-10-CM

## 2020-05-13 NOTE — Therapy (Signed)
[facility] [place], [place], [address] Phone: [phone]   Fax:  [phone]  Physical Therapy Treatment  Patient Details  Name: [name] MRN: [id number] Date of Birth: October 10, 1971 Referring Provider (PT): Berline Lopes PA-C.   Encounter Date: 05/13/2020   PT End of Session - 05/13/20 1131    Visit Number 2    Number of Visits 12    Date for PT Re-Evaluation 06/22/20    Authorization Type FOTO    PT Start Time 1030    PT Stop Time 1121    PT Time Calculation (min) 51 min           No past medical history on file.  Past Surgical History:  Procedure Laterality Date  . CESAREAN SECTION    . TONSILLECTOMY      There were no vitals filed for this visit.   Subjective Assessment - 05/13/20 1122    Subjective Doing Good! LT knee sore and stiff    Pertinent History HTN, C-section.    How long can you walk comfortably? Around home with right knee brace donnned.    Patient Stated Goals Get back to pre-injury status.    Currently in Pain? Yes    Pain Score 3     Pain Location Knee    Pain Orientation Left    Pain Descriptors / Indicators Sore;Tightness    Pain Type Surgical pain                             [facility] Adult PT Treatment/Exercise - 05/13/20 0001      Modalities   Modalities Vasopneumatic;Electrical Stimulation      Electrical Stimulation   Electrical Stimulation Location VMS x 15 mins 10 secs on/off to LT VMO with active quad sets    Electrical Stimulation Goals Tone      Vasopneumatic   Number Minutes Vasopneumatic  15 minutes    Vasopnuematic Location  --   Left knee.   Vasopneumatic Pressure Low    Vasopneumatic Temperature  34      Manual Therapy   Manual Therapy Passive ROM    Passive ROM PROM to LT knee in supine with and without "knee glide"  as well as in  sitting.                       PT Long Term Goals - 05/11/20 1418      PT LONG TERM GOAL #1     Title Independent with a HEP.    Time 6    Period Weeks    Status New      PT LONG TERM GOAL #2   Title Active left knee flexion to 125 degrees so the patient can perform functional tasks and do so with pain not > 2-3/10.    Time 6    Period Weeks    Status New      PT LONG TERM GOAL #3   Title Increase left knee strength to a solid 4+/5 to provide good stability for accomplishment of functional activities.    Time 6    Period Weeks    Status New      PT LONG TERM GOAL #4   Title Perform a reciprocating stair gait with one railing with pain not > 2-3/10.    Time 6    Period Weeks    Status New  PT LONG TERM GOAL #5   Title Walk without brace and no deviation.    Time 6    Period Weeks    Status New                 Plan - 05/13/20 1140    Clinical Impression Statement Pt arrived today doing fairly well. Rx focused on LT knee PROM in seated and suipne without brace f/b VMS x 15 mins 10 secs on/off with active quad set. PROM to 40 degrees today. Normal Vaso response today    Examination-Activity Limitations Locomotion Level    Rehab Potential Excellent    PT Frequency 2x / week    PT Duration 6 weeks    PT Treatment/Interventions ADLs/Self Care Home Management;Cryotherapy;Electrical Stimulation;Ultrasound;Moist Heat;Gait training;Stair training;Functional mobility training;Therapeutic activities;Therapeutic exercise;Neuromuscular re-education;Manual techniques;Patient/family education;Passive range of motion;Vasopneumatic Device    PT Next Visit Plan VMS to left quads during quad sets, left patellar mobs, gentle left knee flexion in supine, progress to Nustep on level 1 for range of motion with brace unlocked to 90 degrees (per MD, during PT).  Per MD.  Therapist to unlock brace from 0-90 degrees as comfortable over the 4 weeks.           Patient will benefit from skilled therapeutic intervention in order to improve the following deficits and impairments:  Pain,  Decreased activity tolerance, Increased edema, Decreased strength, Decreased range of motion, Abnormal gait  Visit Diagnosis: Acute pain of left knee  Stiffness of left knee, not elsewhere classified  Localized edema  Muscle weakness (generalized)     Problem List Patient Active Problem List   Diagnosis Date Noted  . Stress and adjustment reaction 02/28/2013  . Stress headaches 02/28/2013    Eirene Rather,CHRIS, PTA 05/13/2020, 12:00 PM  [facility] [address] [place], [place], [address] Phone: [phone]   Fax:  [phone]  Name: [name] MRN: [id number] Date of Birth: 10/14/1971

## 2020-05-16 ENCOUNTER — Other Ambulatory Visit: Payer: Self-pay

## 2020-05-16 ENCOUNTER — Ambulatory Visit: Payer: No Typology Code available for payment source | Admitting: Physical Therapy

## 2020-05-16 DIAGNOSIS — M25562 Pain in left knee: Secondary | ICD-10-CM

## 2020-05-16 DIAGNOSIS — M25662 Stiffness of left knee, not elsewhere classified: Secondary | ICD-10-CM

## 2020-05-16 DIAGNOSIS — M6281 Muscle weakness (generalized): Secondary | ICD-10-CM

## 2020-05-16 DIAGNOSIS — R600 Localized edema: Secondary | ICD-10-CM

## 2020-05-16 NOTE — Therapy (Signed)
[facility] [place], [place], [address] Phone: [phone]   Fax:  [phone]  Physical Therapy Treatment  Patient Details  Name: [name] MRN: [id number] Date of Birth: May 14, 1971 Referring Provider (PT): Berline Lopes PA-C.   Encounter Date: 05/16/2020   PT End of Session - 05/16/20 1020    Visit Number 3    Number of Visits 12    Date for PT Re-Evaluation 06/22/20    PT Start Time 0900    PT Stop Time 0949    PT Time Calculation (min) 49 min    Activity Tolerance Patient tolerated treatment well    Behavior During Therapy [facility] for tasks assessed/performed           No past medical history on file.  Past Surgical History:  Procedure Laterality Date  . CESAREAN SECTION    . TONSILLECTOMY      There were no vitals filed for this visit.   Subjective Assessment - 05/16/20 0945    Subjective COVID-19 screen performed prior to patient entering clinic.  No new complaints.    How long can you walk comfortably? Around home with right knee brace donnned.    Patient Stated Goals Get back to pre-injury status.    Currently in Pain? Yes    Pain Score 1     Pain Location Knee    Pain Orientation Left    Pain Descriptors / Indicators Sore              [facility] PT Assessment - 05/16/20 0001      AROM   Overall AROM Comments In supine gravity-assisited left knee flexion to 55 degrees.                         [facility] Adult PT Treatment/Exercise - 05/16/20 0001      Exercises   Exercises Knee/Hip      Knee/Hip Exercises: Supine   Quad Sets Limitations Quad sets x 16 minutes (10 sec holds f/b 10 sec rest)      Modalities   Modalities Vasopneumatic      Vasopneumatic   Number Minutes Vasopneumatic  15 minutes    Vasopnuematic Location  --   Left knee.   Vasopneumatic Pressure Low      Manual Therapy   Manual Therapy Passive ROM    Passive ROM Left patellar mobs and gentle left knee range of  motion into flexion x 8 minutes.                       PT Long Term Goals - 05/11/20 1418      PT LONG TERM GOAL #1   Title Independent with a HEP.    Time 6    Period Weeks    Status New      PT LONG TERM GOAL #2   Title Active left knee flexion to 125 degrees so the patient can perform functional tasks and do so with pain not > 2-3/10.    Time 6    Period Weeks    Status New      PT LONG TERM GOAL #3   Title Increase left knee strength to a solid 4+/5 to provide good stability for accomplishment of functional activities.    Time 6    Period Weeks    Status New      PT LONG TERM GOAL #4   Title Perform a  reciprocating stair gait with one railing with pain not > 2-3/10.    Time 6    Period Weeks    Status New      PT LONG TERM GOAL #5   Title Walk without brace and no deviation.    Time 6    Period Weeks    Status New                 Plan - 05/16/20 1021    Clinical Impression Statement Patient did well today with left knee flexion to 55 degrees in supine today.    Examination-Activity Limitations Locomotion Level    Examination-Participation Restrictions Other    Rehab Potential Excellent    PT Frequency 2x / week    PT Duration 6 weeks    PT Treatment/Interventions ADLs/Self Care Home Management;Cryotherapy;Electrical Stimulation;Ultrasound;Moist Heat;Gait training;Stair training;Functional mobility training;Therapeutic activities;Therapeutic exercise;Neuromuscular re-education;Manual techniques;Patient/family education;Passive range of motion;Vasopneumatic Device    PT Next Visit Plan VMS to left quads during quad sets, left patellar mobs, gentle left knee flexion in supine, progress to Nustep on level 1 for range of motion with brace unlocked to 90 degrees (per MD, during PT).  Per MD.  Therapist to unlock brace from 0-90 degrees as comfortable over the 4 weeks.    Consulted and Agree with Plan of Care Patient           Patient will benefit  from skilled therapeutic intervention in order to improve the following deficits and impairments:  Pain, Decreased activity tolerance, Increased edema, Decreased strength, Decreased range of motion, Abnormal gait  Visit Diagnosis: Acute pain of left knee  Stiffness of left knee, not elsewhere classified  Localized edema  Muscle weakness (generalized)     Problem List Patient Active Problem List   Diagnosis Date Noted  . Stress and adjustment reaction 02/28/2013  . Stress headaches 02/28/2013    Manasseh Pittsley, [place]  MPT 05/16/2020, 10:24 AM  [facility] [address] [place], [place], [address] Phone: [phone]   Fax:  [phone]  Name: [name] MRN: [id number] Date of Birth: 1971-04-01

## 2020-05-19 ENCOUNTER — Ambulatory Visit: Payer: No Typology Code available for payment source | Attending: Physician Assistant | Admitting: *Deleted

## 2020-05-19 ENCOUNTER — Other Ambulatory Visit: Payer: Self-pay

## 2020-05-19 DIAGNOSIS — M25662 Stiffness of left knee, not elsewhere classified: Secondary | ICD-10-CM | POA: Diagnosis present

## 2020-05-19 DIAGNOSIS — R600 Localized edema: Secondary | ICD-10-CM | POA: Diagnosis present

## 2020-05-19 DIAGNOSIS — M25562 Pain in left knee: Secondary | ICD-10-CM | POA: Diagnosis not present

## 2020-05-19 DIAGNOSIS — M6281 Muscle weakness (generalized): Secondary | ICD-10-CM | POA: Insufficient documentation

## 2020-05-19 NOTE — Therapy (Signed)
[facility] [address] [place], [place], [address] Phone: [phone]   Fax:  [phone]  Physical Therapy Treatment  Patient Details  Name: [name] MRN: [id number] Date of Birth: 02-24-1971 Referring Provider (PT): Milus Mallick PA-C.   Encounter Date: 05/19/2020   PT End of Session - 05/19/20 1002    Visit Number 4    Number of Visits 12    Date for PT Re-Evaluation 06/22/20    PT Start Time 0901    PT Stop Time 0952    PT Time Calculation (min) 51 min           No past medical history on file.  Past Surgical History:  Procedure Laterality Date   CESAREAN SECTION     TONSILLECTOMY      There were no vitals filed for this visit.                      [facility] Adult PT Treatment/Exercise - 05/19/20 0001      Exercises   Exercises Knee/Hip      Knee/Hip Exercises: Supine   Quad Sets --   with VMS      Modalities   Modalities Vasopneumatic      Electrical Stimulation   Electrical Stimulation Location VMS x 15 mins 10 secs on/off to LT VMO with active quad sets    Electrical Stimulation Goals Tone      Vasopneumatic   Number Minutes Vasopneumatic  10 minutes    Vasopnuematic Location  --   Left knee.   Vasopneumatic Pressure Low    Vasopneumatic Temperature  34      Manual Therapy   Manual Therapy Passive ROM;Soft tissue mobilization    Manual therapy comments 70 degrees today    Passive ROM PROM to LT knee in ssitting  and STW /  IASTM to LT quad in sitting                       PT Long Term Goals - 05/11/20 1418      PT LONG TERM GOAL #1   Title Independent with a HEP.    Time 6    Period Weeks    Status New      PT LONG TERM GOAL #2   Title Active left knee flexion to 125 degrees so the patient can perform functional tasks and do so with pain not > 2-3/10.    Time 6    Period Weeks    Status New      PT LONG TERM GOAL #3   Title Increase left knee strength to a  solid 4+/5 to provide good stability for accomplishment of functional activities.    Time 6    Period Weeks    Status New      PT LONG TERM GOAL #4   Title Perform a reciprocating stair gait with one railing with pain not > 2-3/10.    Time 6    Period Weeks    Status New      PT LONG TERM GOAL #5   Title Walk without brace and no deviation.    Time 6    Period Weeks    Status New                 Plan - 05/19/20 0911    Clinical Impression Statement Pt arrived today doing fairly well with low pain levels LT knee. IASTM to  LT quad while Pt was in seated position f/b  PROM in sitting. PROM to 70 degrees today with minimal pain. VMS to VMO x 12 mins with active QS f/b Vaso.    Rehab Potential Excellent    PT Frequency 2x / week    PT Duration 6 weeks    PT Treatment/Interventions ADLs/Self Care Home Management;Cryotherapy;Electrical Stimulation;Ultrasound;Moist Heat;Gait training;Stair training;Functional mobility training;Therapeutic activities;Therapeutic exercise;Neuromuscular re-education;Manual techniques;Patient/family education;Passive range of motion;Vasopneumatic Device    PT Next Visit Plan VMS to left quads during quad sets, left patellar mobs, gentle left knee flexion in supine, progress to Nustep on level 1 for range of motion with brace unlocked to 90 degrees (per MD, during PT).  Per MD.  Therapist to unlock brace from 0-90 degrees as comfortable over the 4 weeks.    Consulted and Agree with Plan of Care Patient           Patient will benefit from skilled therapeutic intervention in order to improve the following deficits and impairments:  Pain, Decreased activity tolerance, Increased edema, Decreased strength, Decreased range of motion, Abnormal gait  Visit Diagnosis: Acute pain of left knee  Stiffness of left knee, not elsewhere classified  Localized edema  Muscle weakness (generalized)     Problem List Patient Active Problem List   Diagnosis Date  Noted   Stress and adjustment reaction 02/28/2013   Stress headaches 02/28/2013    Jayren Cease,CHRIS, PTA 05/19/2020, 10:06 AM  [facility] [address] [place], [place], [address] Phone: [phone]   Fax:  [phone]  Name: [name] MRN: [id number] Date of Birth: 11/26/1970

## 2020-05-24 ENCOUNTER — Other Ambulatory Visit: Payer: Self-pay

## 2020-05-24 ENCOUNTER — Ambulatory Visit: Payer: No Typology Code available for payment source | Admitting: Physical Therapy

## 2020-05-24 DIAGNOSIS — M25562 Pain in left knee: Secondary | ICD-10-CM | POA: Diagnosis not present

## 2020-05-24 DIAGNOSIS — M25662 Stiffness of left knee, not elsewhere classified: Secondary | ICD-10-CM

## 2020-05-24 DIAGNOSIS — R600 Localized edema: Secondary | ICD-10-CM

## 2020-05-24 DIAGNOSIS — M6281 Muscle weakness (generalized): Secondary | ICD-10-CM

## 2020-05-24 NOTE — Therapy (Signed)
[facility] [address] [place], [place], [address] Phone: [phone]   Fax:  [phone]  Physical Therapy Treatment  Patient Details  Name: [name] MRN: [id number] Date of Birth: [birth year] Referring Provider (PT): Milus Mallick PA-C.   Encounter Date: 05/24/2020   PT End of Session - 05/24/20 1001    Visit Number 5    Number of Visits 12    Date for PT Re-Evaluation 06/22/20    Authorization Type FOTO    PT Start Time 0900    PT Stop Time 0950    PT Time Calculation (min) 50 min    Activity Tolerance Patient tolerated treatment well    Behavior During Therapy [facility] for tasks assessed/performed           No past medical history on file.  Past Surgical History:  Procedure Laterality Date  . CESAREAN SECTION    . TONSILLECTOMY      There were no vitals filed for this visit.   Subjective Assessment - 05/24/20 0948    Subjective COVID-19 screen performed prior to patient entering clinic.  On feet a lot over weekend but doing well now.    Pertinent History HTN, C-section.    Patient Stated Goals Get back to pre-injury status.    Currently in Pain? Yes    Pain Orientation Left    Pain Descriptors / Indicators Sore              [facility] PT Assessment - 05/24/20 0001      AROM   Overall AROM Comments In supine gravity-assisited left knee flexion to 75 degrees.                         [facility] Adult PT Treatment/Exercise - 05/24/20 0001      Knee/Hip Exercises: Supine   Quad Sets Limitations In supine:  Left knee quad sets facilitated with VMS to left quads x 16 minutes with 10 sec holds and and 10 sec rests.      Modalities   Modalities Vasopneumatic      Vasopneumatic   Number Minutes Vasopneumatic  15 minutes    Vasopnuematic Location  --   Left knee.   Vasopneumatic Pressure Medium      Manual Therapy   Passive ROM PROM into left knee flexion with gentle low load long duration stretching x 8  minutes.                       PT Long Term Goals - 05/11/20 1418      PT LONG TERM GOAL #1   Title Independent with a HEP.    Time 6    Period Weeks    Status New      PT LONG TERM GOAL #2   Title Active left knee flexion to 125 degrees so the patient can perform functional tasks and do so with pain not > 2-3/10.    Time 6    Period Weeks    Status New      PT LONG TERM GOAL #3   Title Increase left knee strength to a solid 4+/5 to provide good stability for accomplishment of functional activities.    Time 6    Period Weeks    Status New      PT LONG TERM GOAL #4   Title Perform a reciprocating stair gait with one railing with pain not > 2-3/10.  Time 6    Period Weeks    Status New      PT LONG TERM GOAL #5   Title Walk without brace and no deviation.    Time 6    Period Weeks    Status New                 Plan - 05/24/20 1007    Clinical Impression Statement The patient achieved passive left knee flexion to 75 degrees.    Examination-Activity Limitations Locomotion Level    Examination-Participation Restrictions Other    PT Treatment/Interventions ADLs/Self Care Home Management;Cryotherapy;Electrical Stimulation;Ultrasound;Moist Heat;Gait training;Stair training;Functional mobility training;Therapeutic activities;Therapeutic exercise;Neuromuscular re-education;Manual techniques;Patient/family education;Passive range of motion;Vasopneumatic Device    PT Next Visit Plan VMS to left quads during quad sets, left patellar mobs, gentle left knee flexion in supine, progress to Nustep on level 1 for range of motion with brace unlocked to 90 degrees (per MD, during PT).  Per MD.  Therapist to unlock brace from 0-90 degrees as comfortable over the 4 weeks.    Consulted and Agree with Plan of Care Patient           Patient will benefit from skilled therapeutic intervention in order to improve the following deficits and impairments:     Visit  Diagnosis: Acute pain of left knee  Stiffness of left knee, not elsewhere classified  Localized edema  Muscle weakness (generalized)     Problem List Patient Active Problem List   Diagnosis Date Noted  . Stress and adjustment reaction 02/28/2013  . Stress headaches 02/28/2013    Benton Tooker, [place] MPT 05/24/2020, 10:11 AM  [facility] [address] [place], [place], [address] Phone: [phone]   Fax:  [phone]  Name: [name] MRN: [id number] Date of Birth: 07/25/1971

## 2020-05-26 ENCOUNTER — Ambulatory Visit: Payer: No Typology Code available for payment source | Admitting: Physical Therapy

## 2020-05-26 ENCOUNTER — Other Ambulatory Visit: Payer: Self-pay

## 2020-05-26 DIAGNOSIS — M6281 Muscle weakness (generalized): Secondary | ICD-10-CM

## 2020-05-26 DIAGNOSIS — M25662 Stiffness of left knee, not elsewhere classified: Secondary | ICD-10-CM

## 2020-05-26 DIAGNOSIS — M25562 Pain in left knee: Secondary | ICD-10-CM | POA: Diagnosis not present

## 2020-05-26 DIAGNOSIS — R600 Localized edema: Secondary | ICD-10-CM

## 2020-05-26 NOTE — Therapy (Signed)
[facility] [address] [place], [place], [address] Phone: [phone]   Fax:  [phone]  Physical Therapy Treatment  Patient Details  Name: [name] MRN: [id number] Date of Birth: 1971/02/27 Referring Provider (PT): Milus Mallick PA-C.   Encounter Date: 05/26/2020   PT End of Session - 05/26/20 1029    Visit Number 6    Number of Visits 12    Date for PT Re-Evaluation 06/22/20    Authorization Type FOTO    PT Start Time 0904    PT Stop Time 0955    PT Time Calculation (min) 51 min           No past medical history on file.  Past Surgical History:  Procedure Laterality Date  . CESAREAN SECTION    . TONSILLECTOMY      There were no vitals filed for this visit.   Subjective Assessment - 05/26/20 1028    Subjective COVID-19 screen performed prior to patient entering clinic.              [facility] PT Assessment - 05/26/20 0001      AROM   Overall AROM Comments in supine, gravity assisted left knee flexion to 83 degrees.                         [facility] Adult PT Treatment/Exercise - 05/26/20 0001      Exercises   Exercises Knee/Hip      Knee/Hip Exercises: Supine   Quad Sets Limitations In supine:  left quad sets facilitated with Bi-Phasic e'stim x 16 minutes with 10 sec contractions f/b a 10 sec rest.      Vasopneumatic   Number Minutes Vasopneumatic  20 minutes    Vasopnuematic Location  --   Left.   Vasopneumatic Pressure Medium      Manual Therapy   Manual Therapy Passive ROM    Passive ROM PROM x 8 minutes into left knee flexion with low load long duration stretching technique ultilized.                       PT Long Term Goals - 05/11/20 1418      PT LONG TERM GOAL #1   Title Independent with a HEP.    Time 6    Period Weeks    Status New      PT LONG TERM GOAL #2   Title Active left knee flexion to 125 degrees so the patient can perform functional tasks and do so with  pain not > 2-3/10.    Time 6    Period Weeks    Status New      PT LONG TERM GOAL #3   Title Increase left knee strength to a solid 4+/5 to provide good stability for accomplishment of functional activities.    Time 6    Period Weeks    Status New      PT LONG TERM GOAL #4   Title Perform a reciprocating stair gait with one railing with pain not > 2-3/10.    Time 6    Period Weeks    Status New      PT LONG TERM GOAL #5   Title Walk without brace and no deviation.    Time 6    Period Weeks    Status New                 Plan -  05/26/20 1046    Clinical Impression Statement The patient achieved 83 degrees of passive left knee flexion today.    Examination-Activity Limitations Locomotion Level    Examination-Participation Restrictions Other    Rehab Potential Excellent    PT Frequency 2x / week    PT Duration 6 weeks    PT Treatment/Interventions ADLs/Self Care Home Management;Cryotherapy;Electrical Stimulation;Ultrasound;Moist Heat;Gait training;Stair training;Functional mobility training;Therapeutic activities;Therapeutic exercise;Neuromuscular re-education;Manual techniques;Patient/family education;Passive range of motion;Vasopneumatic Device    PT Next Visit Plan VMS to left quads during quad sets, left patellar mobs, gentle left knee flexion in supine, progress to Nustep on level 1 for range of motion with brace unlocked to 90 degrees (per MD, during PT).  Per MD.  Therapist to unlock brace from 0-90 degrees as comfortable over the 4 weeks.    Consulted and Agree with Plan of Care Patient           Patient will benefit from skilled therapeutic intervention in order to improve the following deficits and impairments:  Pain, Decreased activity tolerance, Increased edema, Decreased strength, Decreased range of motion, Abnormal gait  Visit Diagnosis: Acute pain of left knee  Stiffness of left knee, not elsewhere classified  Localized edema  Muscle weakness  (generalized)     Problem List Patient Active Problem List   Diagnosis Date Noted  . Stress and adjustment reaction 02/28/2013  . Stress headaches 02/28/2013    Travor Royce, [place] MPT 05/26/2020, 10:55 AM  [facility] [address] [place], [place], [address] Phone: [phone]   Fax:  [phone]  Name: [name] MRN: [id number] Date of Birth: January 07, 1971

## 2020-06-06 ENCOUNTER — Other Ambulatory Visit: Payer: Self-pay

## 2020-06-06 ENCOUNTER — Ambulatory Visit: Payer: No Typology Code available for payment source | Admitting: Physical Therapy

## 2020-06-06 DIAGNOSIS — M25562 Pain in left knee: Secondary | ICD-10-CM

## 2020-06-06 DIAGNOSIS — R600 Localized edema: Secondary | ICD-10-CM

## 2020-06-06 DIAGNOSIS — M25662 Stiffness of left knee, not elsewhere classified: Secondary | ICD-10-CM

## 2020-06-06 DIAGNOSIS — M6281 Muscle weakness (generalized): Secondary | ICD-10-CM

## 2020-06-06 NOTE — Therapy (Signed)
[facility] [address] [place], [place], [address] Phone: [phone]   Fax:  [phone]  Physical Therapy Treatment  Patient Details  Name: [name] MRN: [id number] Date of Birth: 06/11/1971 Referring Provider (PT): Milus Mallick PA-C.   Encounter Date: 06/06/2020   PT End of Session - 06/06/20 0922    Visit Number 7    Number of Visits 12    Date for PT Re-Evaluation 06/22/20    Authorization Type FOTO    PT Start Time 0901    PT Stop Time 0950    PT Time Calculation (min) 49 min    Activity Tolerance Patient tolerated treatment well    Behavior During Therapy [facility] for tasks assessed/performed           No past medical history on file.  Past Surgical History:  Procedure Laterality Date  . CESAREAN SECTION    . TONSILLECTOMY      There were no vitals filed for this visit.   Subjective Assessment - 06/06/20 0903    Subjective COVID-19 screen performed prior to patient entering clinic. Patient arrived with some soreness, did a lot on vacation last week and twisted knee while steeping wrong caused soreness.    Pertinent History HTN, C-section.    How long can you walk comfortably? Around home with right knee brace donnned.    Patient Stated Goals Get back to pre-injury status.    Currently in Pain? Yes    Pain Score 3     Pain Location Knee    Pain Orientation Left    Pain Descriptors / Indicators Sore    Pain Type Surgical pain    Pain Onset 1 to 4 weeks ago    Pain Frequency Intermittent    Aggravating Factors  increased activity, movement    Pain Relieving Factors at rest              [facility] PT Assessment - 06/06/20 0001      ROM / Strength   AROM / PROM / Strength PROM      PROM   PROM Assessment Site Knee    Right/Left Knee Left    Left Knee Extension 0    Left Knee Flexion 85                         [facility] Adult PT Treatment/Exercise - 06/06/20 0001      Knee/Hip Exercises:  Supine   Quad Sets Limitations VMS to VMO/quad 10/10 x27min with quad sets for activation      Vasopneumatic   Number Minutes Vasopneumatic  15 minutes    Vasopnuematic Location  Knee    Vasopneumatic Pressure Medium      Manual Therapy   Manual Therapy Passive ROM    Passive ROM manual PROM for left knee flexion with gentle holds to improve mobility up to 85 degrees today                       PT Long Term Goals - 06/06/20 0925      PT LONG TERM GOAL #1   Title Independent with a HEP.    Time 6    Period Weeks    Status On-going      PT LONG TERM GOAL #2   Title Active left knee flexion to 125 degrees so the patient can perform functional tasks and do so with pain not > 2-3/10.  Time 6    Period Weeks    Status On-going      PT LONG TERM GOAL #3   Title Increase left knee strength to a solid 4+/5 to provide good stability for accomplishment of functional activities.    Time 6    Period Weeks    Status On-going      PT LONG TERM GOAL #4   Title Perform a reciprocating stair gait with one railing with pain not > 2-3/10.    Time 6    Period Weeks    Status On-going      PT LONG TERM GOAL #5   Title Walk without brace and no deviation.    Time 6    Period Weeks    Status On-going   continues to wear brace locked at 90 degrees 06/06/20                Plan - 06/06/20 0929    Clinical Impression Statement Patient tolerated treatment well today. Today focused on manual gentl PROM to improve mobility and VMS with quad sets for VMO/quad activation to maintain and improve strength in left knee. Patient improved PROM to 85 degrees today. Patient continue to wear brace a s instructed by MD. Patient goals progressing this week. MD F/U wednesday.    Examination-Activity Limitations Locomotion Level    Examination-Participation Restrictions Other    Rehab Potential Excellent    PT Frequency 2x / week    PT Duration 6 weeks    PT Treatment/Interventions  ADLs/Self Care Home Management;Cryotherapy;Electrical Stimulation;Ultrasound;Moist Heat;Gait training;Stair training;Functional mobility training;Therapeutic activities;Therapeutic exercise;Neuromuscular re-education;Manual techniques;Patient/family education;Passive range of motion;Vasopneumatic Device    PT Next Visit Plan cont with POC per MD recommendations F/U note sent today    Consulted and Agree with Plan of Care Patient           Patient will benefit from skilled therapeutic intervention in order to improve the following deficits and impairments:  Pain, Decreased activity tolerance, Increased edema, Decreased strength, Decreased range of motion, Abnormal gait  Visit Diagnosis: Acute pain of left knee  Stiffness of left knee, not elsewhere classified  Localized edema  Muscle weakness (generalized)     Problem List Patient Active Problem List   Diagnosis Date Noted  . Stress and adjustment reaction 02/28/2013  . Stress headaches 02/28/2013   Cathie Hoops, PTA 06/06/20 9:55 AM   [facility] [address] [place], [place], [address] Phone: [phone]   Fax:  [phone]  Name: [name] MRN: [id number] Date of Birth: 1971/05/08

## 2020-06-09 ENCOUNTER — Encounter: Payer: Self-pay | Admitting: *Deleted

## 2020-06-15 ENCOUNTER — Ambulatory Visit: Payer: No Typology Code available for payment source | Admitting: Physical Therapy

## 2020-06-15 ENCOUNTER — Other Ambulatory Visit: Payer: Self-pay

## 2020-06-15 DIAGNOSIS — M25562 Pain in left knee: Secondary | ICD-10-CM

## 2020-06-15 DIAGNOSIS — M6281 Muscle weakness (generalized): Secondary | ICD-10-CM

## 2020-06-15 DIAGNOSIS — M25662 Stiffness of left knee, not elsewhere classified: Secondary | ICD-10-CM

## 2020-06-15 DIAGNOSIS — R600 Localized edema: Secondary | ICD-10-CM

## 2020-06-15 NOTE — Therapy (Signed)
[facility] [address] [place], [place], [address] Phone: [phone]   Fax:  [phone]  Physical Therapy Treatment  Patient Details  Name: VALINDA FEDIE MRN: [id number] Date of Birth: 1971-08-10 Referring Provider (PT): Milus Mallick PA-C.   Encounter Date: 06/15/2020   PT End of Session - 06/15/20 0928    Visit Number 8    Number of Visits 12    Date for PT Re-Evaluation 06/22/20    Authorization Type FOTO    PT Start Time 0900    PT Stop Time 0950    PT Time Calculation (min) 50 min    Activity Tolerance Patient tolerated treatment well    Behavior During Therapy [facility] for tasks assessed/performed           No past medical history on file.  Past Surgical History:  Procedure Laterality Date  . CESAREAN SECTION    . TONSILLECTOMY      There were no vitals filed for this visit.                      [facility] Adult PT Treatment/Exercise - 06/15/20 0001      Exercises   Exercises Knee/Hip      Knee/Hip Exercises: Aerobic   Recumbent Bike L2 x 10 minutes      Knee/Hip Exercises: Standing   Heel Raises Both;20 reps;Limitations    Heel Raises Limitations toe raises with each heel raise    Rocker Board 2 minutes    Other Standing Knee Exercises mini squats with UE support      Knee/Hip Exercises: Supine   Quad Sets Limitations VMS to VMO for quad activation during quad sets for 10 minutes 10/10 60pps,    Straight Leg Raises Strengthening;10 reps      Vasopneumatic   Number Minutes Vasopneumatic  10 minutes    Vasopnuematic Location  Knee    Vasopneumatic Pressure Medium      Manual Therapy   Manual Therapy Passive ROM    Manual therapy comments 10 minutes    Passive ROM knee flexion 110 degrees                       PT Long Term Goals - 06/15/20 0927      PT LONG TERM GOAL #1   Title Independent with a HEP.    Status On-going      PT LONG TERM GOAL #2   Title Active left knee  flexion to 125 degrees so the patient can perform functional tasks and do so with pain not > 2-3/10.    Status On-going      PT LONG TERM GOAL #3   Status On-going      PT LONG TERM GOAL #4   Title Perform a reciprocating stair gait with one railing with pain not > 2-3/10.    Status On-going      PT LONG TERM GOAL #5   Title Walk without brace and no deviation.    Status On-going                 Plan - 06/15/20 0915    Clinical Impression Statement Pt arriving to therapy with no brace and reported Dr. Williams Che stated she could starting weaning off. Pt arriving with new PT order. Pt tolreating standing closed kinetic chain exercises. VMO/quad activation. PROM 110 degrees. Continue skilled PT.    Examination-Activity Limitations Locomotion Level    Examination-Participation Restrictions  Other    Stability/Clinical Decision Making Stable/Uncomplicated    Rehab Potential Excellent    PT Duration 6 weeks    PT Treatment/Interventions ADLs/Self Care Home Management;Cryotherapy;Electrical Stimulation;Ultrasound;Moist Heat;Gait training;Stair training;Functional mobility training;Therapeutic activities;Therapeutic exercise;Neuromuscular re-education;Manual techniques;Patient/family education;Passive range of motion;Vasopneumatic Device    PT Next Visit Plan MD order to wean brace (pt has been off brace since his MD visit). goal if full ROM    PT Home Exercise Plan SLR, quad sets, mini squats, heel raises    Consulted and Agree with Plan of Care Patient           Patient will benefit from skilled therapeutic intervention in order to improve the following deficits and impairments:  Pain, Decreased activity tolerance, Increased edema, Decreased strength, Decreased range of motion, Abnormal gait  Visit Diagnosis: Acute pain of left knee  Stiffness of left knee, not elsewhere classified  Localized edema  Muscle weakness (generalized)     Problem List Patient Active Problem  List   Diagnosis Date Noted  . Stress and adjustment reaction 02/28/2013  . Stress headaches 02/28/2013    Sharmon Leyden  PT, MPT 06/15/2020, 9:37 AM  [facility] [address] [place], [place], [address] Phone: [phone]   Fax:  [phone]  Name: OUMOU SMEAD MRN: [id number] Date of Birth: [birth year]

## 2020-06-17 ENCOUNTER — Ambulatory Visit: Payer: No Typology Code available for payment source | Admitting: Physical Therapy

## 2020-06-17 ENCOUNTER — Other Ambulatory Visit: Payer: Self-pay

## 2020-06-17 DIAGNOSIS — M25662 Stiffness of left knee, not elsewhere classified: Secondary | ICD-10-CM

## 2020-06-17 DIAGNOSIS — M25562 Pain in left knee: Secondary | ICD-10-CM | POA: Diagnosis not present

## 2020-06-17 DIAGNOSIS — R600 Localized edema: Secondary | ICD-10-CM

## 2020-06-17 DIAGNOSIS — M6281 Muscle weakness (generalized): Secondary | ICD-10-CM

## 2020-06-17 NOTE — Therapy (Signed)
[facility] [address] [place], [place], [address] Phone: [phone]   Fax:  [phone]  Physical Therapy Treatment  Patient Details  Name: TACIE MCCUISTION MRN: [id number] Date of Birth: [birth year] Referring Provider (PT): Milus Mallick PA-C.   Encounter Date: 06/17/2020   PT End of Session - 06/17/20 1054    Visit Number 9    Number of Visits 12    Date for PT Re-Evaluation 06/22/20    Authorization Type FOTO    PT Start Time 0902    PT Stop Time 0959    PT Time Calculation (min) 57 min    Activity Tolerance Patient tolerated treatment well    Behavior During Therapy [facility] for tasks assessed/performed           No past medical history on file.  Past Surgical History:  Procedure Laterality Date  . CESAREAN SECTION    . TONSILLECTOMY      There were no vitals filed for this visit.   Subjective Assessment - 06/17/20 1024    Subjective COVID-19 screen performed prior to patient entering clinic.  Sore from last tretament.    Pertinent History HTN, C-section.    How long can you walk comfortably? Around home with right knee brace donnned.    Patient Stated Goals Get back to pre-injury status.    Currently in Pain? Yes    Pain Score 3     Pain Location Knee    Pain Onset 1 to 4 weeks ago              [facility] PT Assessment - 06/17/20 0001      PROM   PROM Assessment Site Knee    Right/Left Knee Left    Left Knee Extension 0    Left Knee Flexion 110                         [facility] Adult PT Treatment/Exercise - 06/17/20 0001      Exercises   Exercises Knee/Hip      Knee/Hip Exercises: Aerobic   Recumbent Bike Level 3 x 15 minutes moving seat forward x 2 to increase knee flexion.      Knee/Hip Exercises: Supine   Quad Sets Limitations 4 electrode Bi-phasic e'stim (left quads). x 15 minutes with patient performing SAQ's with 10 sec quad holds and 10 sec rest.      Vasopneumatic   Number Minutes  Vasopneumatic  15 minutes    Vasopnuematic Location  --   Left knee.                      PT Long Term Goals - 06/15/20 3875      PT LONG TERM GOAL #1   Title Independent with a HEP.    Status On-going      PT LONG TERM GOAL #2   Title Active left knee flexion to 125 degrees so the patient can perform functional tasks and do so with pain not > 2-3/10.    Status On-going      PT LONG TERM GOAL #3   Title Increase left knee strength to a solid 4+/5 to provide good stability for accomplishment of functional activities.    Status On-going      PT LONG TERM GOAL #4   Title Perform a reciprocating stair gait with one railing with pain not > 2-3/10.    Status On-going  PT LONG TERM GOAL #5   Title Walk without brace and no deviation.    Status On-going                 Plan - 06/17/20 1052    Clinical Impression Statement Patient did well with PT today.  Good contraction of left quadriceps today facilitated wiht bi-phasic e'stim.  In supine the patient achieved active left knee flexion to 110 degrees.    Examination-Activity Limitations Locomotion Level    Examination-Participation Restrictions Other    Stability/Clinical Decision Making Stable/Uncomplicated    Rehab Potential Excellent    PT Frequency 2x / week    PT Duration 6 weeks    PT Treatment/Interventions ADLs/Self Care Home Management;Cryotherapy;Electrical Stimulation;Ultrasound;Moist Heat;Gait training;Stair training;Functional mobility training;Therapeutic activities;Therapeutic exercise;Neuromuscular re-education;Manual techniques;Patient/family education;Passive range of motion;Vasopneumatic Device    PT Next Visit Plan MD order to wean brace (pt has been off brace since his MD visit). goal if full ROM    PT Home Exercise Plan SLR, quad sets, mini squats, heel raises    Consulted and Agree with Plan of Care Patient           Patient will benefit from skilled therapeutic intervention in  order to improve the following deficits and impairments:  Pain, Decreased activity tolerance, Increased edema, Decreased strength, Decreased range of motion, Abnormal gait  Visit Diagnosis: Acute pain of left knee  Stiffness of left knee, not elsewhere classified  Localized edema  Muscle weakness (generalized)     Problem List Patient Active Problem List   Diagnosis Date Noted  . Stress and adjustment reaction 02/28/2013  . Stress headaches 02/28/2013    Cleburn Maiolo, [place] MPT 06/17/2020, 11:00 AM  [facility] [address] [place], [place], [address] Phone: [phone]   Fax:  [phone]  Name: JAILIN MANOCCHIO MRN: [id number] Date of Birth: 1971-08-22

## 2020-06-21 ENCOUNTER — Other Ambulatory Visit: Payer: Self-pay

## 2020-06-21 ENCOUNTER — Ambulatory Visit: Payer: No Typology Code available for payment source | Attending: Physician Assistant | Admitting: Physical Therapy

## 2020-06-21 DIAGNOSIS — M25662 Stiffness of left knee, not elsewhere classified: Secondary | ICD-10-CM | POA: Insufficient documentation

## 2020-06-21 DIAGNOSIS — M25562 Pain in left knee: Secondary | ICD-10-CM | POA: Insufficient documentation

## 2020-06-21 DIAGNOSIS — R600 Localized edema: Secondary | ICD-10-CM | POA: Insufficient documentation

## 2020-06-21 DIAGNOSIS — M6281 Muscle weakness (generalized): Secondary | ICD-10-CM | POA: Insufficient documentation

## 2020-06-21 NOTE — Therapy (Signed)
[facility] [address] [place], [place], [address] Phone: [phone]   Fax:  [phone]  Physical Therapy Treatment  Patient Details  Name: [name] MRN: [id number] Date of Birth: 1971/08/09 Referring Provider (PT): Milus Mallick PA-C.   Encounter Date: 06/21/2020   PT End of Session - 06/21/20 1029    Visit Number 10    Number of Visits 12    Date for PT Re-Evaluation 06/22/20    Authorization Type FOTO    PT Start Time 0900    PT Stop Time 0949    PT Time Calculation (min) 49 min    Activity Tolerance Patient tolerated treatment well    Behavior During Therapy [facility] for tasks assessed/performed           No past medical history on file.  Past Surgical History:  Procedure Laterality Date  . CESAREAN SECTION    . TONSILLECTOMY      There were no vitals filed for this visit.   Subjective Assessment - 06/21/20 1029    Subjective COVID-19 screen performed prior to patient entering clinic.  Doing good.    Pertinent History HTN, C-section.    How long can you walk comfortably? Around home with right knee brace donnned.    Patient Stated Goals Get back to pre-injury status.    Currently in Pain? Yes    Pain Score 2     Pain Location Knee    Pain Orientation Left    Pain Type Surgical pain    Pain Onset 1 to 4 weeks ago                             [facility] Adult PT Treatment/Exercise - 06/21/20 0001      Exercises   Exercises Knee/Hip      Knee/Hip Exercises: Aerobic   Recumbent Bike Level 2 x 10 minutes.    Nustep Level 5 x 5 minutes.      Knee/Hip Exercises: Supine   Quad Sets Limitations 4 electrode VMS to left quads x 15 minutes with 10 sec extension holds and 10 sec rest.      Vasopneumatic   Number Minutes Vasopneumatic  15 minutes    Vasopnuematic Location  --   Left knee.   Vasopneumatic Pressure Low                       PT Long Term Goals - 06/21/20 1034      PT LONG  TERM GOAL #1   Title Independent with a HEP.    Time 6    Period Weeks    Status On-going      PT LONG TERM GOAL #2   Title Active left knee flexion to 125 degrees so the patient can perform functional tasks and do so with pain not > 2-3/10.    Time 6    Period Weeks    Status On-going      PT LONG TERM GOAL #3   Title Increase left knee strength to a solid 4+/5 to provide good stability for accomplishment of functional activities.    Time 6    Period Weeks    Status On-going      PT LONG TERM GOAL #4   Title Perform a reciprocating stair gait with one railing with pain not > 2-3/10.    Time 6    Period Weeks    Status  On-going      PT LONG TERM GOAL #5   Title Walk without brace and no deviation.    Time 6    Period Weeks    Status On-going                 Plan - 06/21/20 1033    Clinical Impression Statement See "Therapy Note" section.    Examination-Activity Limitations Locomotion Level    Examination-Participation Restrictions Other    Stability/Clinical Decision Making Stable/Uncomplicated    Rehab Potential Excellent    PT Frequency 2x / week    PT Duration 6 weeks    PT Treatment/Interventions ADLs/Self Care Home Management;Cryotherapy;Electrical Stimulation;Ultrasound;Moist Heat;Gait training;Stair training;Functional mobility training;Therapeutic activities;Therapeutic exercise;Neuromuscular re-education;Manual techniques;Patient/family education;Passive range of motion;Vasopneumatic Device    PT Next Visit Plan MD order to wean brace (pt has been off brace since his MD visit). goal if full ROM    PT Home Exercise Plan SLR, quad sets, mini squats, heel raises    Consulted and Agree with Plan of Care Patient           Patient will benefit from skilled therapeutic intervention in order to improve the following deficits and impairments:  Pain, Decreased activity tolerance, Increased edema, Decreased strength, Decreased range of motion, Abnormal  gait  Visit Diagnosis: Acute pain of left knee  Stiffness of left knee, not elsewhere classified  Localized edema  Muscle weakness (generalized)     Problem List Patient Active Problem List   Diagnosis Date Noted  . Stress and adjustment reaction 02/28/2013  . Stress headaches 02/28/2013   Progress Note Reporting Period 05/11/20 to 06/21/20.  See note below for Objective Data and Assessment of Progress/Goals.  The patient is progressing well toward goals.  Her left knee range of motion has improved a great deal since beginning PT.       Brandii Lakey, [place] MPT 06/21/2020, 10:35 AM  [facility] [address] [place], [place], [address] Phone: [phone]   Fax:  [phone]  Name: [name] MRN: [id number] Date of Birth: 1971/03/12

## 2020-06-23 ENCOUNTER — Ambulatory Visit: Payer: No Typology Code available for payment source | Admitting: Physical Therapy

## 2020-06-23 ENCOUNTER — Other Ambulatory Visit: Payer: Self-pay

## 2020-06-23 DIAGNOSIS — M25562 Pain in left knee: Secondary | ICD-10-CM

## 2020-06-23 DIAGNOSIS — M25662 Stiffness of left knee, not elsewhere classified: Secondary | ICD-10-CM

## 2020-06-23 DIAGNOSIS — R600 Localized edema: Secondary | ICD-10-CM

## 2020-06-23 DIAGNOSIS — M6281 Muscle weakness (generalized): Secondary | ICD-10-CM

## 2020-06-23 NOTE — Therapy (Signed)
[facility] [address] [place], [place], [address] Phone: [phone]   Fax:  [phone]  Physical Therapy Treatment  Patient Details  Name: [name] MRN: [id number] Date of Birth: 03-12-1971 Referring Provider (PT): Milus Mallick PA-C.   Encounter Date: 06/23/2020   PT End of Session - 06/23/20 1025    Visit Number 11    Number of Visits 18    Date for PT Re-Evaluation 07/20/20    Authorization Type FOTO    PT Start Time 0903    PT Stop Time 0957    PT Time Calculation (min) 54 min    Activity Tolerance Patient tolerated treatment well    Behavior During Therapy [facility] for tasks assessed/performed           No past medical history on file.  Past Surgical History:  Procedure Laterality Date  . CESAREAN SECTION    . TONSILLECTOMY      There were no vitals filed for this visit.   Subjective Assessment - 06/23/20 1020    Subjective COVID-19 screen performed prior to patient entering clinic.  Pain at a 1 today.    Pertinent History HTN, C-section.    How long can you walk comfortably? Around home with right knee brace donnned.    Patient Stated Goals Get back to pre-injury status.    Currently in Pain? Yes    Pain Score 1     Pain Location Knee    Pain Orientation Left    Pain Descriptors / Indicators Sore    Pain Type Surgical pain    Pain Onset 1 to 4 weeks ago              [facility] PT Assessment - 06/23/20 0001      PROM   PROM Assessment Site Knee    Right/Left Knee Left    Left Knee Extension 0    Left Knee Flexion 120                         [facility] Adult PT Treatment/Exercise - 06/23/20 0001      Exercises   Exercises Knee/Hip      Knee/Hip Exercises: Stretches   Gastroc Stretch Limitations 5 minutes on [place] in parallel bars.      Knee/Hip Exercises: Aerobic   Recumbent Bike level 3 x 15 minutes.      Knee/Hip Exercises: Standing   Other Standing Knee Exercises 14 inch box  lunges in parallel bars x 3 minutes.                       PT Long Term Goals - 06/21/20 1034      PT LONG TERM GOAL #1   Title Independent with a HEP.    Time 6    Period Weeks    Status On-going      PT LONG TERM GOAL #2   Title Active left knee flexion to 125 degrees so the patient can perform functional tasks and do so with pain not > 2-3/10.    Time 6    Period Weeks    Status On-going      PT LONG TERM GOAL #3   Title Increase left knee strength to a solid 4+/5 to provide good stability for accomplishment of functional activities.    Time 6    Period Weeks    Status On-going      PT LONG TERM  GOAL #4   Title Perform a reciprocating stair gait with one railing with pain not > 2-3/10.    Time 6    Period Weeks    Status On-going      PT LONG TERM GOAL #5   Title Walk without brace and no deviation.    Time 6    Period Weeks    Status On-going                 Plan - 06/23/20 1023    Clinical Impression Statement Patient is highly motivated and is making excellent progress with active left knee flexion to 120 degrees today.    Examination-Activity Limitations Locomotion Level    Examination-Participation Restrictions Other    Stability/Clinical Decision Making Stable/Uncomplicated    Rehab Potential Excellent    PT Frequency 2x / week    PT Duration 6 weeks    PT Treatment/Interventions ADLs/Self Care Home Management;Cryotherapy;Electrical Stimulation;Ultrasound;Moist Heat;Gait training;Stair training;Functional mobility training;Therapeutic activities;Therapeutic exercise;Neuromuscular re-education;Manual techniques;Patient/family education;Passive range of motion;Vasopneumatic Device    PT Next Visit Plan MD order to wean brace (pt has been off brace since his MD visit). goal if full ROM    PT Home Exercise Plan SLR, quad sets, mini squats, heel raises    Consulted and Agree with Plan of Care Patient           Patient will benefit from  skilled therapeutic intervention in order to improve the following deficits and impairments:  Pain, Decreased activity tolerance, Increased edema, Decreased strength, Decreased range of motion, Abnormal gait  Visit Diagnosis: Acute pain of left knee - Plan: PT plan of care cert/re-cert  Stiffness of left knee, not elsewhere classified - Plan: PT plan of care cert/re-cert  Localized edema - Plan: PT plan of care cert/re-cert  Muscle weakness (generalized) - Plan: PT plan of care cert/re-cert     Problem List Patient Active Problem List   Diagnosis Date Noted  . Stress and adjustment reaction 02/28/2013  . Stress headaches 02/28/2013    Cederick Broadnax, [place] MPT 06/23/2020, 10:30 AM  [facility] [address] [place], [place], [address] Phone: [phone]   Fax:  [phone]  Name: [name] MRN: [id number] Date of Birth: 01/14/1971

## 2020-06-28 ENCOUNTER — Other Ambulatory Visit: Payer: Self-pay

## 2020-06-28 ENCOUNTER — Ambulatory Visit: Payer: No Typology Code available for payment source | Admitting: *Deleted

## 2020-06-28 DIAGNOSIS — R600 Localized edema: Secondary | ICD-10-CM

## 2020-06-28 DIAGNOSIS — M25662 Stiffness of left knee, not elsewhere classified: Secondary | ICD-10-CM

## 2020-06-28 DIAGNOSIS — M6281 Muscle weakness (generalized): Secondary | ICD-10-CM

## 2020-06-28 DIAGNOSIS — M25562 Pain in left knee: Secondary | ICD-10-CM | POA: Diagnosis not present

## 2020-06-28 NOTE — Therapy (Signed)
[facility] [address] [place], [place], [address] Phone: [phone]   Fax:  [phone]  Physical Therapy Treatment  Patient Details  Name: [name] MRN: [id number] Date of Birth: 1971-09-15 Referring Provider (PT): Milus Mallick PA-C.   Encounter Date: 06/28/2020   PT End of Session - 06/28/20 0905    Visit Number 12    Number of Visits 18    Date for PT Re-Evaluation 07/20/20    Authorization Type FOTO    PT Start Time 0900    PT Stop Time 0951    PT Time Calculation (min) 51 min           No past medical history on file.  Past Surgical History:  Procedure Laterality Date  . CESAREAN SECTION    . TONSILLECTOMY      There were no vitals filed for this visit.   Subjective Assessment - 06/28/20 0904    Subjective COVID-19 screen performed prior to patient entering clinic.  Pain 1-2/10. Knee feels stiff.    Pertinent History HTN, C-section.    How long can you walk comfortably? Around home with right knee brace donnned.    Patient Stated Goals Get back to pre-injury status.    Currently in Pain? Yes    Pain Score 2     Pain Location Knee    Pain Orientation Left    Pain Descriptors / Indicators Sore    Pain Type Surgical pain    Pain Onset 1 to 4 weeks ago                             [facility] Adult PT Treatment/Exercise - 06/28/20 0001      Exercises   Exercises Knee/Hip      Knee/Hip Exercises: Aerobic   Recumbent Bike level 5 x 15 minutes.      Knee/Hip Exercises: Standing   Step Down Left;2 sets;20 reps;Step Height: 4";Hand Hold: 2    Rocker Board 3 minutes   PF/DF and calf stretching   SLS LT LE x 3 mins on airex    Other Standing Knee Exercises 14 inch box lunges in parallel bars 2x10    Other Standing Knee Exercises toe raises 2x15-20 reps for anterior tib.      Vasopneumatic   Number Minutes Vasopneumatic  15 minutes    Vasopnuematic Location  Knee    Vasopneumatic Pressure Low     Vasopneumatic Temperature  34  edema                       PT Long Term Goals - 06/21/20 1034      PT LONG TERM GOAL #1   Title Independent with a HEP.    Time 6    Period Weeks    Status On-going      PT LONG TERM GOAL #2   Title Active left knee flexion to 125 degrees so the patient can perform functional tasks and do so with pain not > 2-3/10.    Time 6    Period Weeks    Status On-going      PT LONG TERM GOAL #3   Title Increase left knee strength to a solid 4+/5 to provide good stability for accomplishment of functional activities.    Time 6    Period Weeks    Status On-going      PT LONG TERM GOAL #4  Title Perform a reciprocating stair gait with one railing with pain not > 2-3/10.    Time 6    Period Weeks    Status On-going      PT LONG TERM GOAL #5   Title Walk without brace and no deviation.    Time 6    Period Weeks    Status On-going                 Plan - 06/28/20 0907    Clinical Impression Statement Pt arrived today 8 1/2 weeks post-op ACL surgery doing fairly well. She was able to progress with proprioception and strengthening exs for RT LE and did well. No reports of incraesed pain, but mainly tightness.. Normal vaso response.    Examination-Activity Limitations Locomotion Level    Stability/Clinical Decision Making Stable/Uncomplicated    Rehab Potential Excellent    PT Duration 6 weeks    PT Treatment/Interventions ADLs/Self Care Home Management;Cryotherapy;Electrical Stimulation;Ultrasound;Moist Heat;Gait training;Stair training;Functional mobility training;Therapeutic activities;Therapeutic exercise;Neuromuscular re-education;Manual techniques;Patient/family education;Passive range of motion;Vasopneumatic Device    PT Next Visit Plan MD order to wean brace (pt has been off brace since his MD visit). goal if full ROM    PT Home Exercise Plan SLR, quad sets, mini squats, heel raises           Patient will benefit from  skilled therapeutic intervention in order to improve the following deficits and impairments:  Pain, Decreased activity tolerance, Increased edema, Decreased strength, Decreased range of motion, Abnormal gait  Visit Diagnosis: Acute pain of left knee  Stiffness of left knee, not elsewhere classified  Localized edema  Muscle weakness (generalized)     Problem List Patient Active Problem List   Diagnosis Date Noted  . Stress and adjustment reaction 02/28/2013  . Stress headaches 02/28/2013    Deaisha Welborn,CHRIS, PTA 06/28/2020, 12:08 PM  [facility] [address] [place], [place], [address] Phone: [phone]   Fax:  [phone]  Name: [name] MRN: [id number] Date of Birth: 1970/11/27

## 2020-06-30 ENCOUNTER — Encounter: Payer: Self-pay | Admitting: *Deleted

## 2020-07-05 ENCOUNTER — Ambulatory Visit: Payer: No Typology Code available for payment source | Admitting: *Deleted

## 2020-07-05 ENCOUNTER — Other Ambulatory Visit: Payer: Self-pay

## 2020-07-05 DIAGNOSIS — M25662 Stiffness of left knee, not elsewhere classified: Secondary | ICD-10-CM

## 2020-07-05 DIAGNOSIS — M25562 Pain in left knee: Secondary | ICD-10-CM | POA: Diagnosis not present

## 2020-07-05 DIAGNOSIS — R600 Localized edema: Secondary | ICD-10-CM

## 2020-07-05 DIAGNOSIS — M6281 Muscle weakness (generalized): Secondary | ICD-10-CM

## 2020-07-05 NOTE — Therapy (Signed)
[facility] [address] [place], [place], [address] Phone: [phone]   Fax:  [phone]  Physical Therapy Treatment  Patient Details  Name: [name] MRN: [id number] Date of Birth: 1971/06/21 Referring Provider (PT): Milus Mallick PA-C.   Encounter Date: 07/05/2020   PT End of Session - 07/05/20 0939    Visit Number 13    Number of Visits 18    Date for PT Re-Evaluation 07/20/20    Authorization Type FOTO    PT Start Time 0900    PT Stop Time 0951    PT Time Calculation (min) 51 min           No past medical history on file.  Past Surgical History:  Procedure Laterality Date  . CESAREAN SECTION    . TONSILLECTOMY      There were no vitals filed for this visit.   Subjective Assessment - 07/05/20 0910    Subjective COVID-19 screen performed prior to patient entering clinic.  Pain 1-2/10. Knee feels stiff. Did good after last Rx    Pertinent History HTN, C-section.    How long can you walk comfortably? Around home with right knee brace donnned.    Patient Stated Goals Get back to pre-injury status.    Currently in Pain? No/denies    Pain Location Knee    Pain Orientation Left    Pain Descriptors / Indicators Sore                             [facility] Adult PT Treatment/Exercise - 07/05/20 0001      Exercises   Exercises Knee/Hip      Knee/Hip Exercises: Aerobic   Recumbent Bike level 5 x 15 minutes.      Knee/Hip Exercises: Standing   Lateral Step Up Left;2 sets;Hand Hold: 1;15 reps    Step Down Left;2 sets;20 reps;Step Height: 4";Hand Hold: 2    Rocker Board 3 minutes   PF/DF and calf stretching   SLS LT LE x 3 mins on airex    Other Standing Knee Exercises 14 inch box lunges in parallel bars 3x10    Other Standing Knee Exercises toe raises 2x15-20 reps for anterior tib.      Vasopneumatic   Number Minutes Vasopneumatic  15 minutes    Vasopnuematic Location  Knee    Vasopneumatic Pressure  Low    Vasopneumatic Temperature  34  edema                       PT Long Term Goals - 06/21/20 1034      PT LONG TERM GOAL #1   Title Independent with a HEP.    Time 6    Period Weeks    Status On-going      PT LONG TERM GOAL #2   Title Active left knee flexion to 125 degrees so the patient can perform functional tasks and do so with pain not > 2-3/10.    Time 6    Period Weeks    Status On-going      PT LONG TERM GOAL #3   Title Increase left knee strength to a solid 4+/5 to provide good stability for accomplishment of functional activities.    Time 6    Period Weeks    Status On-going      PT LONG TERM GOAL #4   Title Perform a reciprocating stair gait with  one railing with pain not > 2-3/10.    Time 6    Period Weeks    Status On-going      PT LONG TERM GOAL #5   Title Walk without brace and no deviation.    Time 6    Period Weeks    Status On-going                 Plan - 07/05/20 0941    Clinical Impression Statement Pt arrived today doing fairly well with mainly LT knee soreness. She was guided through LE strengthening exs as well as proprioception and did well. Mainly mm fatigue end of Rx  AROM LT knee 0-130 degrees today. Vaso response normal.    Examination-Activity Limitations Locomotion Level    Stability/Clinical Decision Making Stable/Uncomplicated    Rehab Potential Excellent    PT Frequency 2x / week    PT Treatment/Interventions ADLs/Self Care Home Management;Cryotherapy;Electrical Stimulation;Ultrasound;Moist Heat;Gait training;Stair training;Functional mobility training;Therapeutic activities;Therapeutic exercise;Neuromuscular re-education;Manual techniques;Patient/family education;Passive range of motion;Vasopneumatic Device    PT Next Visit Plan MD order to wean brace (pt has been off brace since his MD visit). goal if full ROM. To MD tomorrow    Consulted and Agree with Plan of Care Patient           Patient will benefit  from skilled therapeutic intervention in order to improve the following deficits and impairments:  Pain, Decreased activity tolerance, Increased edema, Decreased strength, Decreased range of motion, Abnormal gait  Visit Diagnosis: Acute pain of left knee  Stiffness of left knee, not elsewhere classified  Localized edema  Muscle weakness (generalized)     Problem List Patient Active Problem List   Diagnosis Date Noted  . Stress and adjustment reaction 02/28/2013  . Stress headaches 02/28/2013    Gerod Caligiuri,CHRIS, PTA 07/05/2020, 10:53 AM  [facility] [address] [place], [place], [address] Phone: [phone]   Fax:  [phone]  Name: [name] MRN: [id number] Date of Birth: [birth year]

## 2020-07-07 ENCOUNTER — Other Ambulatory Visit: Payer: Self-pay

## 2020-07-07 ENCOUNTER — Ambulatory Visit: Payer: No Typology Code available for payment source | Admitting: *Deleted

## 2020-07-07 DIAGNOSIS — M25662 Stiffness of left knee, not elsewhere classified: Secondary | ICD-10-CM

## 2020-07-07 DIAGNOSIS — M25562 Pain in left knee: Secondary | ICD-10-CM | POA: Diagnosis not present

## 2020-07-07 DIAGNOSIS — R600 Localized edema: Secondary | ICD-10-CM

## 2020-07-07 DIAGNOSIS — M6281 Muscle weakness (generalized): Secondary | ICD-10-CM

## 2020-07-07 NOTE — Therapy (Signed)
[facility] [place], [place], [address] Phone: [phone]   Fax:  [phone]  Physical Therapy Treatment  Patient Details  Name: [name] MRN: [id number]  Date of Birth: October 05, 1971 Referring Provider (PT): Berline Lopes PA-C.   Encounter Date: 07/07/2020   PT End of Session - 07/07/20 0929    Visit Number 14    Number of Visits 18    Date for PT Re-Evaluation 07/20/20    Authorization Type FOTO   DC FOTO   14th visit   25%    PT Start Time 0900    PT Stop Time 0951    PT Time Calculation (min) 51 min           No past medical history on file.  Past Surgical History:  Procedure Laterality Date  . CESAREAN SECTION    . TONSILLECTOMY      There were no vitals filed for this visit.   Subjective Assessment - 07/07/20 0923    Subjective COVID-19 screen performed prior to patient entering clinic.  Pain 1-2/10. Knee feels stiff. Did good after last Rx. Dr was pleased and said I could DC after today    Pertinent History HTN, C-section.    How long can you walk comfortably? Around home with right knee brace donnned.    Patient Stated Goals Get back to pre-injury status.    Currently in Pain? No/denies    Pain Orientation Left    Pain Descriptors / Indicators Sore    Pain Onset More than a month ago                             [facility] Adult PT Treatment/Exercise - 07/07/20 0001      Exercises   Exercises Knee/Hip      Knee/Hip Exercises: Aerobic   Recumbent Bike level 5 x 15 minutes.      Knee/Hip Exercises: Machines for Strengthening   Cybex Knee Extension 10# x20 90-30 degrees    Cybex Knee Flexion 30# x20      Knee/Hip Exercises: Standing   Lateral Step Up Left;2 sets;Hand Hold: 1;15 reps    Forward Step Up Left;1 set;15 reps    Step Down Left;2 sets;20 reps;Step Height: 4";Hand Hold: 2    Rocker Board 3 minutes   PF/DF and calf stretching   SLS LT LE x 3 mins on airex    Other  Standing Knee Exercises 14 inch box lunges in parallel bars 3x10    Other Standing Knee Exercises toe raises 2x15-20 reps for anterior tib.                       PT Long Term Goals - 07/07/20 0930      PT LONG TERM GOAL #1   Title Independent with a HEP.    Time 6    Period Weeks    Status Achieved      PT LONG TERM GOAL #2   Title Active left knee flexion to 125 degrees so the patient can perform functional tasks and do so with pain not > 2-3/10.    Time 6    Period Weeks    Status Achieved      PT LONG TERM GOAL #3   Title Increase left knee strength to a solid 4+/5 to provide good stability for accomplishment of functional activities.    Period Weeks  Status Not Met   4/5 NM     PT LONG TERM GOAL #4   Title Perform a reciprocating stair gait with one railing with pain not > 2-3/10.    Time 6    Period Weeks    Status Achieved      PT LONG TERM GOAL #5   Title Walk without brace and no deviation.    Time 6    Period Weeks    Status Achieved                 Plan - 07/07/20 0956    Clinical Impression Statement Pt arrived today doing very well and reports MD said she can DC to gym and HEP. Pt did great today with Rx and was able to meet all LTGs except strength  due to mild deficit 4/5. DC to HEP/ Gym exs.    Examination-Activity Limitations Locomotion Level    Stability/Clinical Decision Making Stable/Uncomplicated    Rehab Potential Excellent    PT Duration 6 weeks    PT Treatment/Interventions ADLs/Self Care Home Management;Cryotherapy;Electrical Stimulation;Ultrasound;Moist Heat;Gait training;Stair training;Functional mobility training;Therapeutic activities;Therapeutic exercise;Neuromuscular re-education;Manual techniques;Patient/family education;Passive range of motion;Vasopneumatic Device    PT Next Visit Plan DC to HEP    PT Home Exercise Plan SLR, quad sets, mini squats, heel raises    Consulted and Agree with Plan of Care Patient             Patient will benefit from skilled therapeutic intervention in order to improve the following deficits and impairments:  Pain, Decreased activity tolerance, Increased edema, Decreased strength, Decreased range of motion, Abnormal gait  Visit Diagnosis: Acute pain of left knee  Stiffness of left knee, not elsewhere classified  Localized edema  Muscle weakness (generalized)     Problem List Patient Active Problem List   Diagnosis Date Noted  . Stress and adjustment reaction 02/28/2013  . Stress headaches 02/28/2013    Mekai Wilkinson,CHRIS, PTA 07/07/2020, 1:05 PM  [facility] [place], [place], [address] Phone: [phone]   Fax:  [phone]  Name: [name] MRN: [id number] Date of Birth: [birth year]
